# Patient Record
Sex: Male | Born: 1970 | Race: White | Hispanic: Yes | Marital: Married | Smoking: Light tobacco smoker
Health system: Southern US, Community
[De-identification: ages and names within clinical notes are randomized; demographics above are authoritative.]

## PROBLEM LIST (undated history)

## (undated) DIAGNOSIS — F419 Anxiety disorder, unspecified: Secondary | ICD-10-CM

## (undated) DIAGNOSIS — M1990 Unspecified osteoarthritis, unspecified site: Secondary | ICD-10-CM

## (undated) DIAGNOSIS — G4733 Obstructive sleep apnea (adult) (pediatric): Secondary | ICD-10-CM

## (undated) DIAGNOSIS — F329 Major depressive disorder, single episode, unspecified: Secondary | ICD-10-CM

## (undated) DIAGNOSIS — F32A Depression, unspecified: Secondary | ICD-10-CM

## (undated) DIAGNOSIS — I10 Essential (primary) hypertension: Secondary | ICD-10-CM

## (undated) DIAGNOSIS — N189 Chronic kidney disease, unspecified: Secondary | ICD-10-CM

## (undated) DIAGNOSIS — L400 Psoriasis vulgaris: Secondary | ICD-10-CM

## (undated) DIAGNOSIS — H269 Unspecified cataract: Secondary | ICD-10-CM

## (undated) HISTORY — DX: Unspecified osteoarthritis, unspecified site: M19.90

## (undated) HISTORY — DX: Psoriasis vulgaris: L40.0

## (undated) HISTORY — DX: Chronic kidney disease, unspecified: N18.9

## (undated) HISTORY — DX: Anxiety disorder, unspecified: F41.9

## (undated) HISTORY — DX: Depression, unspecified: F32.A

## (undated) HISTORY — DX: Unspecified cataract: H26.9

## (undated) HISTORY — DX: Obstructive sleep apnea (adult) (pediatric): G47.33

## (undated) HISTORY — DX: Major depressive disorder, single episode, unspecified: F32.9

## (undated) HISTORY — PX: EYE SURGERY: SHX253

## (undated) HISTORY — DX: Essential (primary) hypertension: I10

---

## 2017-02-15 ENCOUNTER — Telehealth: Payer: Self-pay

## 2017-02-15 NOTE — Telephone Encounter (Signed)
Pre visit call completed 

## 2017-02-18 ENCOUNTER — Ambulatory Visit (INDEPENDENT_AMBULATORY_CARE_PROVIDER_SITE_OTHER): Payer: BLUE CROSS/BLUE SHIELD | Admitting: Family Medicine

## 2017-02-18 ENCOUNTER — Encounter: Payer: Self-pay | Admitting: Family Medicine

## 2017-02-18 VITALS — BP 96/70 | HR 87 | Temp 98.6°F | Ht 67.0 in | Wt 237.6 lb

## 2017-02-18 DIAGNOSIS — L400 Psoriasis vulgaris: Secondary | ICD-10-CM | POA: Diagnosis not present

## 2017-02-18 DIAGNOSIS — F419 Anxiety disorder, unspecified: Secondary | ICD-10-CM

## 2017-02-18 DIAGNOSIS — I10 Essential (primary) hypertension: Secondary | ICD-10-CM

## 2017-02-18 DIAGNOSIS — M7061 Trochanteric bursitis, right hip: Secondary | ICD-10-CM

## 2017-02-18 DIAGNOSIS — M7062 Trochanteric bursitis, left hip: Secondary | ICD-10-CM

## 2017-02-18 LAB — CBC
HCT: 43.6 % (ref 39.0–52.0)
Hemoglobin: 14.6 g/dL (ref 13.0–17.0)
MCHC: 33.6 g/dL (ref 30.0–36.0)
MCV: 92 fl (ref 78.0–100.0)
PLATELETS: 381 10*3/uL (ref 150.0–400.0)
RBC: 4.73 Mil/uL (ref 4.22–5.81)
RDW: 13.7 % (ref 11.5–15.5)
WBC: 6.4 10*3/uL (ref 4.0–10.5)

## 2017-02-18 LAB — COMPREHENSIVE METABOLIC PANEL
ALBUMIN: 3.8 g/dL (ref 3.5–5.2)
ALK PHOS: 66 U/L (ref 39–117)
ALT: 16 U/L (ref 0–53)
AST: 15 U/L (ref 0–37)
BILIRUBIN TOTAL: 0.6 mg/dL (ref 0.2–1.2)
BUN: 12 mg/dL (ref 6–23)
CALCIUM: 9.2 mg/dL (ref 8.4–10.5)
CO2: 26 mEq/L (ref 19–32)
CREATININE: 0.75 mg/dL (ref 0.40–1.50)
Chloride: 104 mEq/L (ref 96–112)
GFR: 120.3 mL/min (ref >60.00)
Glucose, Bld: 99 mg/dL (ref 70–99)
Potassium: 3.6 mEq/L (ref 3.5–5.1)
Sodium: 137 mEq/L (ref 135–145)
TOTAL PROTEIN: 7.5 g/dL (ref 6.0–8.3)

## 2017-02-18 MED ORDER — METHYLPREDNISOLONE ACETATE 40 MG/ML IJ SUSP
40.0000 mg | Freq: Once | INTRAMUSCULAR | Status: AC
  Administered 2017-02-18: 40 mg

## 2017-02-18 MED ORDER — LISINOPRIL 20 MG PO TABS: 20.0000 mg | ORAL_TABLET | Freq: Every day | ORAL | 1 refills | Status: DC

## 2017-02-18 MED ORDER — CLONAZEPAM 1 MG PO TABS: 1.0000 mg | ORAL_TABLET | Freq: Every day | ORAL | 0 refills | Status: DC | PRN

## 2017-02-18 MED ORDER — PAROXETINE HCL 10 MG PO TABS: 10.0000 mg | ORAL_TABLET | Freq: Every day | ORAL | 1 refills | Status: DC

## 2017-02-18 NOTE — Progress Notes (Signed)
Chief Complaint  Patient presents with  . Establish Care    skin issues-5 years       New Patient Visit SUBJECTIVE: HPI: Michael Solomon is an 46 y.o.male who is being seen for establishing care.  The patient was previously seen at office in [place]. Here with his wife.   The patient has a history of plaque psoriasis. He currently receives monthly injections of Cosentyx. Initially, these were helping, but he has recently had a flare. When he alerted his dermatologist, no changes were made outside of homeopathic recommendations. He is very itchy and is requesting to see another dermatologist. He is currently following with Dr. Michele Mcalpine at [facility] dermatology. The patient requires blood work to have his prescription renewed.  He's also had a chronic history of bilateral hip pain. No injury or change in activity. It is located only outside. No catching or locking. No numbness or tingling.  No Known Allergies  Past Medical History:  Diagnosis Date  . Anxiety   . Arthritis   . Arthritis   . Cataract   . Chronic kidney disease   . Depression   . Hypertension   . Plaque psoriasis    Past Surgical History:  Procedure Laterality Date  . EYE SURGERY Bilateral    Social History   Social History  . Marital status: Married   Social History Main Topics  . Smoking status: Never Smoker  . Smokeless tobacco: Never Used  . Alcohol use No  . Drug use: No   Family History  Problem Relation Age of Onset  . Cancer Mother   . Dementia Mother   . Hypertension Mother   . Thyroid disease Mother   . Diabetes Father   . Heart disease Father   . Stroke Father   . Hypertension Father   . Diabetes Brother   . Depression Brother      Current Outpatient Prescriptions:  .  lisinopril (PRINIVIL,ZESTRIL) 20 MG tablet, Take 1 tablet (20 mg total) by mouth daily., Disp: 90 tablet, Rfl: 1 .  clonazePAM (KLONOPIN) 1 MG tablet, Take 1 tablet (1 mg total) by mouth daily as needed for anxiety.,  Disp: 30 tablet, Rfl: 0 .  PARoxetine (PAXIL) 10 MG tablet, Take 1 tablet (10 mg total) by mouth daily., Disp: 90 tablet, Rfl: 1 .  Secukinumab (COSENTYX SENSOREADY PEN) 150 MG/ML SOAJ, Inject 300 mg into the skin every 30 (thirty) days. , Disp: , Rfl:   ROS Skin: As noted in HPI  MSK: +hip pain   OBJECTIVE: BP 96/70 (BP Location: Left Arm, Patient Position: Sitting, Cuff Size: Large)   Pulse 87   Temp 98.6 F (37 C) (Oral)   Ht 5' 7"  (1.702 m)   Wt 237 lb 9.6 oz (107.8 kg)   SpO2 98%   BMI 37.21 kg/m   Constitutional: -  VS reviewed -  Well developed, well nourished, appears stated age -  No apparent distress  Psychiatric: -  Oriented to person, place, and time -  Memory intact -  Affect and mood normal -  Fluent conversation, good eye contact -  Judgment and insight age appropriate  Eye: -  Conjunctivae clear, no discharge -  Pupils symmetric, round, reactive to light  ENMT: -  Oral mucosa without lesions, tongue and uvula midline    Tonsils not enlarged, no erythema, no exudate, trachea midline    Pharynx moist, no lesions, no erythema  Neck: -  No gross swelling, no palpable masses -  Thyroid  midline, not enlarged, mobile, no palpable masses  Cardiovascular: -  RRR, no murmurs -  No LE edema  Respiratory: -  Normal respiratory effort, no accessory muscle use, no retraction -  Breath sounds equal, no wheezes, no ronchi, no crackles  Neurological:  -  CN II - XII grossly intact -  Sensation grossly intact to light touch, equal bilaterally  Musculoskeletal: -  No clubbing, no cyanosis -  +TTP over greater troch b/l; neg log roll, [place], FABER/FADDIR b/l  Skin: -  Scaly and faintly erythematous lesions/plaques noted on UE's, LE's, post-auricular, and torso, no drainage or fluctuance   Procedure note: Greater trochanteric bursa injection, bilateral Verbal consent obtained. The area of interest was palpated and demarcated with an otoscope speculum. It was cleaned  with an alcohol swab. Freeze spray was used. A 27 g needle was inserted at a perpendicular angle through the area of interested. The plunger was withdrawn to ensure our placement was not in a vessel. 2 mL of 1% lidocaine without epi and 40 mg of Depomedrol was injected. A bandaid was placed. This process was repeated on the contralateral side. The patient tolerated the procedure well.  There were no complications noted.    ASSESSMENT/PLAN: Plaque psoriasis - Plan: CBC, Comprehensive metabolic panel, Hepatitis B Surface AntiGEN, Ambulatory referral to Dermatology  Anxiety - Plan: clonazePAM (KLONOPIN) 1 MG tablet, PARoxetine (PAXIL) 10 MG tablet  Essential hypertension - Plan: lisinopril (PRINIVIL,ZESTRIL) 20 MG tablet  Trochanteric bursitis of both hips  Patient instructed to sign release of records form from his previous PCP. Patient should return at his earliest convenience for CPE. Will have him sign CSC at next visit. The patient voiced understanding and agreement to the plan.   [place], DO 02/18/17  9:59 AM

## 2017-02-18 NOTE — Patient Instructions (Signed)
If you do not hear anything about your referral in the next week, call our office and ask for an update.  Let us know if you need anything.

## 2017-02-18 NOTE — Addendum Note (Signed)
Addended by: Harl Bowie on: 02/18/2017 11:28 AM   Modules accepted: Orders

## 2017-02-19 LAB — HEPATITIS B SURFACE ANTIGEN: Hepatitis B Surface Ag: NEGATIVE

## 2017-02-20 ENCOUNTER — Telehealth: Payer: Self-pay | Admitting: *Deleted

## 2017-02-20 NOTE — Telephone Encounter (Signed)
-----   Message from Shelda Pal, DO sent at 02/20/2017  7:07 AM EDT ----- Let pt know his labs are normal. Please fax to [facility] on his behalf as well. He sees Dr Michele Mcalpine there. TY.

## 2017-02-21 NOTE — Telephone Encounter (Signed)
Recent lab results faxed to Dr. Michele Mcalpine at [facility] @ [phone]).

## 2017-02-21 NOTE — Telephone Encounter (Signed)
Called and spoke with the pts wife on (02/20/17) and informed her of the pt's recent lab results and note.  She verbalized understanding and she had no questions or concerns.  Informed her that I will be faxing the results to [facility].//AB/CMA

## 2017-02-22 NOTE — Telephone Encounter (Signed)
Dr. Erskin Burnet office called in to make CMA aware that fax isn't going through. She said that they are only receiving cover sheet.

## 2017-02-28 NOTE — Telephone Encounter (Signed)
Tried on several attempts to fax the pt's recent lab results to Dr. Michele Mcalpine at [facility].  Fax was unable to go through.  Mailed the recent lab results to Dr. Hassan Rowan

## 2017-03-07 NOTE — Telephone Encounter (Addendum)
Dr Michele Mcalpine office calling, stating there should have been a TB Blood work, please advise. Call back # [phone] ext [phone] Maudie Mercury

## 2017-03-12 NOTE — Telephone Encounter (Signed)
Michael Solomon called back, states she never received the fax, the correct fax number directly to her desk is [phone], please resend the fax asap.

## 2017-03-18 ENCOUNTER — Encounter: Payer: Self-pay | Admitting: Family Medicine

## 2017-03-18 ENCOUNTER — Ambulatory Visit (INDEPENDENT_AMBULATORY_CARE_PROVIDER_SITE_OTHER): Payer: BLUE CROSS/BLUE SHIELD | Admitting: Family Medicine

## 2017-03-18 ENCOUNTER — Other Ambulatory Visit: Payer: Self-pay | Admitting: Family Medicine

## 2017-03-18 VITALS — BP 120/84 | HR 97 | Temp 98.5°F | Ht 67.0 in | Wt 239.2 lb

## 2017-03-18 DIAGNOSIS — Z111 Encounter for screening for respiratory tuberculosis: Secondary | ICD-10-CM | POA: Diagnosis not present

## 2017-03-18 DIAGNOSIS — M67951 Unspecified disorder of synovium and tendon, right thigh: Secondary | ICD-10-CM

## 2017-03-18 DIAGNOSIS — M6798 Unspecified disorder of synovium and tendon, other site: Secondary | ICD-10-CM

## 2017-03-18 DIAGNOSIS — M67952 Unspecified disorder of synovium and tendon, left thigh: Secondary | ICD-10-CM | POA: Diagnosis not present

## 2017-03-18 DIAGNOSIS — G8929 Other chronic pain: Secondary | ICD-10-CM

## 2017-03-18 DIAGNOSIS — Z23 Encounter for immunization: Secondary | ICD-10-CM | POA: Diagnosis not present

## 2017-03-18 DIAGNOSIS — M545 Low back pain, unspecified: Secondary | ICD-10-CM

## 2017-03-18 DIAGNOSIS — N50819 Testicular pain, unspecified: Secondary | ICD-10-CM

## 2017-03-18 DIAGNOSIS — Z0000 Encounter for general adult medical examination without abnormal findings: Secondary | ICD-10-CM | POA: Diagnosis not present

## 2017-03-18 DIAGNOSIS — R0681 Apnea, not elsewhere classified: Secondary | ICD-10-CM

## 2017-03-18 DIAGNOSIS — Z131 Encounter for screening for diabetes mellitus: Secondary | ICD-10-CM | POA: Diagnosis not present

## 2017-03-18 DIAGNOSIS — Z125 Encounter for screening for malignant neoplasm of prostate: Secondary | ICD-10-CM

## 2017-03-18 LAB — LIPID PANEL
CHOLESTEROL: 149 mg/dL (ref 0–200)
HDL: 38.6 mg/dL — ABNORMAL LOW (ref >39.00)
LDL Cholesterol: 96 mg/dL (ref 0–99)
NonHDL: 110.84
Total CHOL/HDL Ratio: 4
Triglycerides: 72 mg/dL (ref 0.0–149.0)
VLDL: 14.4 mg/dL (ref 0.0–40.0)

## 2017-03-18 LAB — COMPREHENSIVE METABOLIC PANEL
ALBUMIN: 4.2 g/dL (ref 3.5–5.2)
ALT: 14 U/L (ref 0–53)
AST: 13 U/L (ref 0–37)
Alkaline Phosphatase: 80 U/L (ref 39–117)
BUN: 12 mg/dL (ref 6–23)
CALCIUM: 8.6 mg/dL (ref 8.4–10.5)
CHLORIDE: 104 meq/L (ref 96–112)
CO2: 26 mEq/L (ref 19–32)
CREATININE: 0.73 mg/dL (ref 0.40–1.50)
GFR: 124.07 mL/min (ref >60.00)
Glucose, Bld: 104 mg/dL — ABNORMAL HIGH (ref 70–99)
Potassium: 3.8 mEq/L (ref 3.5–5.1)
Sodium: 137 mEq/L (ref 135–145)
Total Bilirubin: 0.5 mg/dL (ref 0.2–1.2)
Total Protein: 6.8 g/dL (ref 6.0–8.3)

## 2017-03-18 LAB — CBC
HEMATOCRIT: 43.8 % (ref 39.0–52.0)
Hemoglobin: 14.5 g/dL (ref 13.0–17.0)
MCHC: 33 g/dL (ref 30.0–36.0)
MCV: 93.6 fl (ref 78.0–100.0)
PLATELETS: 275 10*3/uL (ref 150.0–400.0)
RBC: 4.68 Mil/uL (ref 4.22–5.81)
RDW: 14.3 % (ref 11.5–15.5)
WBC: 7.3 10*3/uL (ref 4.0–10.5)

## 2017-03-18 LAB — HEMOGLOBIN A1C: HEMOGLOBIN A1C: 6 % (ref 4.6–6.5)

## 2017-03-18 LAB — PSA: PSA: 0.26 ng/mL (ref 0.10–4.00)

## 2017-03-18 MED ORDER — MELOXICAM 15 MG PO TABS: 15.0000 mg | ORAL_TABLET | Freq: Every day | ORAL | 0 refills | Status: DC

## 2017-03-18 NOTE — Progress Notes (Signed)
Chief Complaint  Patient presents with  . Annual Exam    fasting    Well Male Michael Solomon is here for a complete physical.   His last physical was >1 year ago.  Current diet: in general, a "healthy" diet   Current exercise: active at work Weight trend: stable Does pt snore? Yes Daytime fatigue? No. Seat belt? Yes.     Health maintenance Tetanus- No HIV- Yes Prostate cancer screening- No   The patient is having continued pain in both of his hips (over the greater trochanteric region) and low back. 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 He was given bilateral steroid injections which helped for 3 days and then wore off. He's never been to physical therapy.  He also mentioned that he feels a nodule on his testicles. He cannot find it today. His wife states that he mention it was painful, however he states he is having no pain. No urinary discomfort or bleeding. No skin lesions.  His dermatologist is requesting that we obtain a QuantiFERON gold to screen for TB.  Past Medical History:  Diagnosis Date  . Anxiety   . Arthritis   . Arthritis   . Cataract   . Chronic kidney disease   . Depression   . Hypertension   . Plaque psoriasis     Past Surgical History:  Procedure Laterality Date  . EYE SURGERY Bilateral    Medications  Current Outpatient Prescriptions on File Prior to Visit  Medication Sig Dispense Refill  . clonazePAM (KLONOPIN) 1 MG tablet Take 1 tablet (1 mg total) by mouth daily as needed for anxiety. 30 tablet 0  . lisinopril (PRINIVIL,ZESTRIL) 20 MG tablet Take 1 tablet (20 mg total) by mouth daily. 90 tablet 1  . PARoxetine (PAXIL) 10 MG tablet Take 1 tablet (10 mg total) by mouth daily. 90 tablet 1  . Secukinumab (COSENTYX SENSOREADY PEN) 150 MG/ML SOAJ Inject 300 mg into the skin every 30 (thirty) days.      Allergies No Known Allergies Family History Family History  Problem Relation Age of Onset  . Cancer Mother   . Dementia Mother   .  Hypertension Mother   . Thyroid disease Mother   . Diabetes Father   . Heart disease Father   . Stroke Father   . Hypertension Father   . Diabetes Brother   . Depression Brother     Review of Systems: Constitutional:  no unexpected change in weight, no fevers or chills Eye:  no recent significant change in vision Ear/Nose/Mouth/Throat:  Ears:  no tinnitus or hearing loss Nose/Mouth/Throat:  no complaints of nasal congestion or bleeding, no sore throat and oral sores Cardiovascular:  no chest pain, no palpitations Respiratory:  no cough and no shortness of breath Gastrointestinal:  no abdominal pain, no change in bowel habits, no nausea, vomiting, diarrhea, or constipation and no black or bloody stool GU:  Male: negative for dysuria, frequency, and incontinence and negative for prostate symptoms Musculoskeletal/Extremities: +low back and b/l hip pain; otherwise no pain, redness, or swelling of the joints Integumentary (Skin/Breast):  no abnormal skin lesions reported Neurologic:  no headaches, no numbness, tingling Endocrine: No weight changes, masses in the neck, heat/cold intolerance, bowel or skin changes, or cardiovascular system symptoms Hematologic/Lymphatic:  no abnormal bleeding, no HIV risk factors, no night sweats, no swollen nodes, no weight loss  Exam BP 120/84 (BP Location: Left Arm, Patient Position: Sitting, Cuff Size: Normal)   Pulse 97  Temp 98.5 F (36.9 C) (Oral)   Ht 5' 7"  (1.702 m)   Wt 239 lb 3.2 oz (108.5 kg)   SpO2 97%   BMI 37.46 kg/m  General:  well developed, well nourished, in no apparent distress Skin: +scaling and erythema over forearms/posterior ears/neck; otherwise no significant moles, warts, or growths Head:  no masses, lesions, or tenderness Eyes:  pupils equal and round, sclera anicteric without injection Ears:  canals without lesions, TMs shiny without retraction, no obvious effusion, no erythema Nose:  nares patent, septum midline, mucosa  normal Throat/Pharynx:  lips and gingiva without lesion; tongue and uvula midline; non-inflamed pharynx; no exudates or postnasal drainage Neck: neck supple without adenopathy, thyromegaly, or masses Lungs:  clear to auscultation, breath sounds equal bilaterally, no respiratory distress Cardio:  regular rate and rhythm without murmurs, heart sounds without clicks or rubs Abdomen:  abdomen soft, nontender; bowel sounds normal; no masses or organomegaly Genital (male): Uncircumcised penis, no lesions or discharge; testes present bilaterally without masses or tenderness Rectal: Deferred Musculoskeletal:  symmetrical muscle groups noted without atrophy or deformity Extremities:  no clubbing, cyanosis, or edema, no deformities, no skin discoloration Neuro:  gait normal; deep tendon reflexes normal and symmetric Psych: well oriented with normal range of affect and appropriate judgment/insight  Assessment and Plan  Well adult exam - Plan: CBC, Comprehensive metabolic panel, Lipid panel  Witnessed apneic spells - Plan: Ambulatory referral to Pulmonology  Screening for prostate cancer - Plan: PSA  Screening for diabetes mellitus - Plan: Hemoglobin A1c  Chronic bilateral low back pain without sciatica - Plan: meloxicam (MOBIC) 15 MG tablet, Ambulatory referral to Physical Therapy  Tendinopathy of left gluteus medius - Plan: meloxicam (MOBIC) 15 MG tablet, Ambulatory referral to Physical Therapy  Screening-pulmonary TB - Plan: Quantiferon tb gold assay  Tendinopathy of right gluteus medius - Plan: meloxicam (MOBIC) 15 MG tablet, Ambulatory referral to Physical Therapy   Well 46 y.o. male. Counseled on diet and exercise. Counseled on the risks and benefits of PSA screening, he agreed to go forth with screening. Screening for sleep apnea. We'll obtain QuantiFERON gold and his blood testing at the request of his dermatologist. I do not feel anything on exam and he could not pinpoint where the  nodules were during the visit. I will order an US of the area to make sure nothing is there that shouldn't be and for reassurance. We'll trial short course of Mobic and refer to physical therapy for his pain.  Other orders as above. Follow up in 6 mo pending the above workup. The patient voiced understanding and agreement to the plan.  [place], DO 03/18/17 11:04 AM

## 2017-03-18 NOTE — Patient Instructions (Addendum)
Give [place] 2-3 business days to get the results of your labs back. If labs are normal, you will likely receive a letter in the mail unless you have MyChart. This can take longer than 2-3 business days.   Find out about your pneumonia vaccination history.  Let us know if you need anything.

## 2017-03-18 NOTE — Addendum Note (Signed)
Addended by: Harl Bowie on: 03/18/2017 12:05 PM   Modules accepted: Orders

## 2017-03-19 LAB — QUANTIFERON TB GOLD ASSAY (BLOOD)
Interferon Gamma Release Assay: POSITIVE — AB
Mitogen-Nil: 9.71 IU/mL
Quantiferon Nil Value: 0.02 IU/mL
Quantiferon Tb Ag Minus Nil Value: 9.78 IU/mL

## 2017-03-19 NOTE — Telephone Encounter (Signed)
Left detailed message on Michael Solomon's voicemail below that TB blood test is not final yet and we will fax all labs to most recent fax # below and to call if any additional questions.

## 2017-03-20 NOTE — Telephone Encounter (Signed)
Per Dr. Nani Ravens please reach out to [facility] regarding the pt's recent postive Quantiferon Gold TB.  To see if they want a CXR on the pt.  The pt thought he may need one also.  He is asymptomatic.  Called and spoke with Colletta Maryland and informed her of the message from Dr. Nani Ravens.  She asked me to fax the results and she will contact Dr. Michele Mcalpine and she will give me a call back.  Results faxed.

## 2017-03-21 ENCOUNTER — Telehealth: Payer: Self-pay | Admitting: *Deleted

## 2017-03-21 DIAGNOSIS — R7612 Nonspecific reaction to cell mediated immunity measurement of gamma interferon antigen response without active tuberculosis: Secondary | ICD-10-CM

## 2017-03-21 NOTE — Telephone Encounter (Signed)
-----   Message from Shelda Pal, DO sent at 03/19/2017  2:40 PM EDT ----- Prediabetes. 10 yr CVD is 1.4%. 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 I imagine they will want a CXR with this result. Will try to reach out to see.  AB- Let pt know his sugar is a little high and his other labs look good. We ordered a lab at the request of his dermatologist that was positive (Quantiferon Gold for Tb). Let's see if we can reach out to their office regarding this result and if they want a CXR. The patient thought he may need one also. He is asymptomatic. TY.

## 2017-03-21 NOTE — Telephone Encounter (Signed)
Faxed recent lab results to Dr. [facility] on (03/20/17).  Awaiting response from Dermatology.//AB/CMA

## 2017-03-21 NOTE — Telephone Encounter (Signed)
Called and spoke with the pt's wife regarding the pt's recent lab results and note.  She verbalized understanding and agreed.  Also informed her that I faxed the labs to Dr. [facility].//AB/CMA

## 2017-03-22 NOTE — Telephone Encounter (Signed)
Called and [facility] @ 9:01am @ [phone]) asking Colletta Maryland to RTC regarding the pt's recent lab results.//AB/CMA

## 2017-03-23 ENCOUNTER — Encounter (HOSPITAL_BASED_OUTPATIENT_CLINIC_OR_DEPARTMENT_OTHER): Payer: BLUE CROSS/BLUE SHIELD

## 2017-03-23 ENCOUNTER — Ambulatory Visit (HOSPITAL_BASED_OUTPATIENT_CLINIC_OR_DEPARTMENT_OTHER): Payer: BLUE CROSS/BLUE SHIELD

## 2017-03-26 NOTE — Telephone Encounter (Signed)
Caller name: Maudie Mercury @ [facility] Can be reached: [phone] ext [phone]  Reason for call: Dr. Michele Mcalpine reviewed TB results. This time it is positive (last time was negative). He would like to have a chest xray completed please.

## 2017-03-27 NOTE — Telephone Encounter (Addendum)
Spoke with the pt's wife and informed her of the message below from Dr. Michele Mcalpine office.  She verbalized understanding and agreed.  Informed her that the pt can come for the x-ray at his convenience.  She stated that they are having some problems with their insurance, so it maybe a little while before the pt can come and get the x-ray.  Made Dr. Nani Ravens aware and he stated that it's fine.//AB/CMA

## 2017-03-27 NOTE — Telephone Encounter (Signed)
Called and [facility] @ 8:11am @ [phone]) asking the pt's wife to RTC regarding the message below.//AB/CMA

## 2017-03-30 ENCOUNTER — Ambulatory Visit (HOSPITAL_BASED_OUTPATIENT_CLINIC_OR_DEPARTMENT_OTHER): Payer: BLUE CROSS/BLUE SHIELD

## 2017-04-17 ENCOUNTER — Telehealth: Payer: Self-pay | Admitting: Family Medicine

## 2017-04-17 ENCOUNTER — Other Ambulatory Visit: Payer: Self-pay | Admitting: Family Medicine

## 2017-04-17 DIAGNOSIS — G8929 Other chronic pain: Secondary | ICD-10-CM

## 2017-04-17 DIAGNOSIS — M67952 Unspecified disorder of synovium and tendon, left thigh: Secondary | ICD-10-CM

## 2017-04-17 DIAGNOSIS — M545 Low back pain: Principal | ICD-10-CM

## 2017-04-17 DIAGNOSIS — M6798 Unspecified disorder of synovium and tendon, other site: Secondary | ICD-10-CM

## 2017-04-17 DIAGNOSIS — M67951 Unspecified disorder of synovium and tendon, right thigh: Secondary | ICD-10-CM

## 2017-04-17 DIAGNOSIS — F419 Anxiety disorder, unspecified: Secondary | ICD-10-CM

## 2017-04-17 MED ORDER — CLONAZEPAM 1 MG PO TABS: 1.0000 mg | ORAL_TABLET | Freq: Every day | ORAL | 1 refills | Status: DC | PRN

## 2017-04-17 NOTE — Telephone Encounter (Signed)
OK to refill 30 w 1 refill, I would like to see him at his earliest convenience to discuss anxiety. TY.
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
## 2017-04-17 NOTE — Telephone Encounter (Signed)
Please advise on Klonopin refill  Last filled 02/18/17 Qty: 30 Rf:0 Last ov 03/18/17

## 2017-04-17 NOTE — Telephone Encounter (Signed)
Caller name: [place]  Relation to pt: self  Call back number: [phone] Pharmacy:  Reason for call: Pt is requesting refill on clonazePAM (KLONOPIN) 1 MG tablet. Please advise.

## 2017-04-24 ENCOUNTER — Encounter: Payer: Self-pay | Admitting: Family Medicine

## 2017-04-24 ENCOUNTER — Ambulatory Visit (INDEPENDENT_AMBULATORY_CARE_PROVIDER_SITE_OTHER): Payer: BLUE CROSS/BLUE SHIELD | Admitting: Family Medicine

## 2017-04-24 VITALS — BP 108/82 | HR 76 | Temp 98.5°F | Ht 67.0 in | Wt 239.0 lb

## 2017-04-24 DIAGNOSIS — F411 Generalized anxiety disorder: Secondary | ICD-10-CM | POA: Diagnosis not present

## 2017-04-24 MED ORDER — PAROXETINE HCL 20 MG PO TABS: 20.0000 mg | ORAL_TABLET | Freq: Every day | ORAL | 2 refills | Status: DC

## 2017-04-24 NOTE — Progress Notes (Signed)
Pre visit review using our clinic review tool, if applicable. No additional management support is needed unless otherwise documented below in the visit note. 

## 2017-04-24 NOTE — Patient Instructions (Addendum)
Please consider counseling. Contact [phone] to schedule an appointment or inquire about cost/insurance coverage.  Let us know if you need anything.

## 2017-04-24 NOTE — Progress Notes (Signed)
Chief Complaint  Patient presents with  . Medication Refill    followup on anxiety medication    Subjective Michael Solomon is an 46 y.o. male who presents with anxiety. Currently taking Paxil 10 mg daily and Klonopin 1 mg daily prn. He ends up using the latter on a daily basis due to stressors. Compliant with Paxil Family history significant for anxiety and depression. Possible organic causes contributing are: none Social stressors include very ill mother who he is responsible for caring for. No self medication. No SI or HI. He does not exercise. He is following with a psychologist.  Past Medical History:  Diagnosis Date  . Anxiety   . Arthritis   . Arthritis   . Cataract   . Chronic kidney disease   . Depression   . Hypertension   . Plaque psoriasis     Medications Current Outpatient Prescriptions on File Prior to Visit  Medication Sig Dispense Refill  . ammonium lactate (LAC-HYDRIN) 12 % lotion APPLY TO THE AFFECTED AREAS OF SKIN BID PRN  2  . clonazePAM (KLONOPIN) 1 MG tablet Take 1 tablet (1 mg total) by mouth daily as needed for anxiety. 30 tablet 1  . lisinopril (PRINIVIL,ZESTRIL) 20 MG tablet Take 1 tablet (20 mg total) by mouth daily. 90 tablet 1  . meloxicam (MOBIC) 15 MG tablet TAKE 1 TABLET(15 MG) BY MOUTH DAILY 30 tablet 0  . PARoxetine (PAXIL) 10 MG tablet Take 1 tablet (10 mg total) by mouth daily. 90 tablet 1  . Secukinumab (COSENTYX SENSOREADY PEN) 150 MG/ML SOAJ Inject 300 mg into the skin every 30 (thirty) days.      Allergies No Known Allergies Family History Family History  Problem Relation Age of Onset  . Cancer Mother   . Dementia Mother   . Hypertension Mother   . Thyroid disease Mother   . Diabetes Father   . Heart disease Father   . Stroke Father   . Hypertension Father   . Diabetes Brother   . Depression Brother      Review Of Systems Constitutional:  no unexplained fevers, sweats, or chills Cardiovascular:  no chest pain, no  palpitations Gastrointestinal:  no nausea, vomiting, diarrhea, or constipation Psychiatric: as noted in HPI  Exam BP 108/82 (BP Location: Left Arm, Patient Position: Sitting, Cuff Size: Normal)   Pulse 76   Temp 98.5 F (36.9 C) (Oral)   Ht 5' 7"  (1.702 m)   Wt 239 lb (108.4 kg)   SpO2 96%   BMI 37.43 kg/m  General:  well developed, well nourished, in no apparent distress Neck: neck supple without adenopathy, thyromegaly, or masses Lungs:  clear to auscultation, breath sounds equal bilaterally, normal respiratory effort without accessory muscle use Cardio:  regular rate and rhythm without murmurs Neuro:  deep tendon reflexes normal and symmetric and no cerebellar signs or ataxia noted Psych: well oriented with normal range of affect and age-appropriate judgement/insight  Assessment and Plan  GAD (generalized anxiety disorder) - Plan: PARoxetine (PAXIL) 20 MG tablet  Status: New to me  Increase dose of Paxil to 20 mg daily from 10 mg daily. Cont Klonopin. # for counseling given. Counseled on exercise helping with anxiety.  F.u in 1 mo. If no improvement, will increase to 30 mg vs adding Buspirone.  Patient voiced understanding and agreement to the plan.  [place], DO 04/24/17 11:46 AM

## 2017-06-11 ENCOUNTER — Other Ambulatory Visit: Payer: Self-pay | Admitting: Family Medicine

## 2017-06-11 DIAGNOSIS — G8929 Other chronic pain: Secondary | ICD-10-CM

## 2017-06-11 DIAGNOSIS — M545 Low back pain, unspecified: Secondary | ICD-10-CM

## 2017-06-11 DIAGNOSIS — M6798 Unspecified disorder of synovium and tendon, other site: Secondary | ICD-10-CM

## 2017-06-11 DIAGNOSIS — M67951 Unspecified disorder of synovium and tendon, right thigh: Secondary | ICD-10-CM

## 2017-06-11 DIAGNOSIS — M67952 Unspecified disorder of synovium and tendon, left thigh: Secondary | ICD-10-CM

## 2017-06-21 ENCOUNTER — Other Ambulatory Visit: Payer: Self-pay | Admitting: Family Medicine

## 2017-06-21 DIAGNOSIS — F419 Anxiety disorder, unspecified: Secondary | ICD-10-CM

## 2017-06-24 ENCOUNTER — Ambulatory Visit (INDEPENDENT_AMBULATORY_CARE_PROVIDER_SITE_OTHER): Payer: BLUE CROSS/BLUE SHIELD | Admitting: Pulmonary Disease

## 2017-06-24 ENCOUNTER — Encounter: Payer: Self-pay | Admitting: Pulmonary Disease

## 2017-06-24 VITALS — BP 130/88 | HR 104 | Ht 67.0 in | Wt 242.4 lb

## 2017-06-24 DIAGNOSIS — G4733 Obstructive sleep apnea (adult) (pediatric): Secondary | ICD-10-CM | POA: Insufficient documentation

## 2017-06-24 DIAGNOSIS — Z23 Encounter for immunization: Secondary | ICD-10-CM | POA: Diagnosis not present

## 2017-06-24 HISTORY — DX: Obstructive sleep apnea (adult) (pediatric): G47.33

## 2017-06-24 NOTE — Telephone Encounter (Signed)
Requesting:   clonazepam Contract    none UDS     none Last OV     04/24/2017 Last Refill   #30 with 1 refill on 04/17/2017  Please Advise

## 2017-06-24 NOTE — Assessment & Plan Note (Addendum)
Given excessive daytime somnolence, narrow pharyngeal exam, witnessed apneas & loud snoring, obstructive sleep apnea is very likely & an overnight polysomnogram will be scheduled as a homestudy. The pathophysiology of obstructive sleep apnea , it's cardiovascular consequences & modes of treatment including CPAP were discused with the patient in detail & they evidenced understanding.  Pretest probability is high and he will very likely need a CPAP titration study subsequently  His body jerks during sleep are likely also related to apneas and hopefully will improve with CPAP therapy

## 2017-06-24 NOTE — Progress Notes (Signed)
Subjective:    Patient ID: Michael Solomon, male    DOB: [birth year], 46 y.o.   MRN: [id number]  HPI   Chief Complaint  Patient presents with  . Advice Only    new consult. has apnea and loud snoring per wife. feels tired all the time.     67 year old obese man originally from [place], presents accompanied by his wife for evaluation of sleep disordered breathing.  His wife provides a sleep history. She reports witnessed apneas where he briefly stops breathing especially when sleeping on his back.  She reports loud snoring and jerking movements occasionally wake him up from his sleep. His father has obstructive sleep apnea and uses a CPAP machine. He has worked there is third shift for at least the last 15 years and so his sleep habits are very variable.  He takes care of his mom in the daytime.  On work days he will sleep from 8 AM to 10 AM and then be awake until 5 PM when he sleeps again until 10 PM, on his nights off he reverts back to her nighttime sleeping schedule. Epworth sleepiness score is 9 and reports sleepiness as a passenger in a car, lying down to rest in the afternoons, sleep latency is minimal, he reports multiple awakenings including for bathroom visits, he sleeps on his left side with one pillow.  He wakes up with a non-refreshing sleep feeling tired with dryness of mouth and occasional headache.  He has gained 20 pounds over the last 2 years. He has high blood pressure controlled on 2 medications. He has anxiety for which she takes clonazepam.  He also has been treated in the past for latent tuberculosis  There is no history suggestive of cataplexy, sleep paralysis or parasomnias    Past Medical History:  Diagnosis Date  . Anxiety   . Arthritis   . Arthritis   . Cataract   . Chronic kidney disease   . Depression   . Hypertension   . Plaque psoriasis      Past Surgical History:  Procedure Laterality Date  . EYE SURGERY Bilateral     No Known  Allergies Social History   Socioeconomic History  . Marital status: Married    Spouse name: Not on file  . Number of children: Not on file  . Years of education: Not on file  . Highest education level: Not on file  Social Needs  . Financial resource strain: Not on file  . Food insecurity - worry: Not on file  . Food insecurity - inability: Not on file  . Transportation needs - medical: Not on file  . Transportation needs - non-medical: Not on file  Occupational History  . Not on file  Tobacco Use  . Smoking status: Light Tobacco Smoker    Packs/day: 0.10    Years: 25.00    Pack years: 2.50    Types: Cigarettes  . Smokeless tobacco: Never Used  Substance and Sexual Activity  . Alcohol use: No  . Drug use: No  . Sexual activity: Not on file  Other Topics Concern  . Not on file  Social History Narrative  . Not on file      Family History  Problem Relation Age of Onset  . Cancer Mother   . Dementia Mother   . Hypertension Mother   . Thyroid disease Mother   . Diabetes Father   . Heart disease Father   . Stroke Father   . Hypertension Father   .  Diabetes Brother   . Depression Brother      Review of Systems  HENT: Positive for dental problem, ear pain and sneezing.   Eyes: Positive for itching.  Respiratory: Positive for chest tightness and wheezing.   Cardiovascular: Positive for palpitations.  Musculoskeletal: Positive for joint swelling.  Skin: Positive for rash.  Allergic/Immunologic: Negative.   Neurological: Positive for headaches.  Psychiatric/Behavioral: The patient is nervous/anxious.        Objective:   Physical Exam  Gen. Pleasant, obese, in no distress, normal affect ENT -enlarged tonsils, no post nasal drip, class 2-3 airway Neck: No JVD, no thyromegaly, no carotid bruits Lungs: no use of accessory muscles, no dullness to percussion, decreased without rales or rhonchi  Cardiovascular: Rhythm regular, heart sounds  normal, no murmurs or  gallops, no peripheral edema Abdomen: soft and non-tender, no hepatosplenomegaly, BS normal. Musculoskeletal: No deformities, no cyanosis or clubbing Neuro:  alert, non focal, no tremors       Assessment & Plan:

## 2017-06-24 NOTE — Telephone Encounter (Signed)
Called the patient informed sent in. Scheduled followup appt with PCP for next Monday 07/01/2017

## 2017-06-24 NOTE — Patient Instructions (Signed)
Home sleep study. Based on this, you will likely need a CPAP machine Flu shot today

## 2017-06-24 NOTE — Telephone Encounter (Signed)
Rx sent electronically. Please schedule him for follow up. TY.

## 2017-07-01 ENCOUNTER — Ambulatory Visit: Payer: BLUE CROSS/BLUE SHIELD | Admitting: Family Medicine

## 2017-07-22 ENCOUNTER — Telehealth: Payer: Self-pay | Admitting: Pulmonary Disease

## 2017-07-22 DIAGNOSIS — G4733 Obstructive sleep apnea (adult) (pediatric): Secondary | ICD-10-CM | POA: Diagnosis not present

## 2017-07-22 NOTE — Telephone Encounter (Signed)
Rodena Piety has spoke to the wife

## 2017-07-23 ENCOUNTER — Telehealth: Payer: Self-pay | Admitting: Pulmonary Disease

## 2017-07-23 DIAGNOSIS — G4733 Obstructive sleep apnea (adult) (pediatric): Secondary | ICD-10-CM | POA: Diagnosis not present

## 2017-07-23 NOTE — Telephone Encounter (Signed)
Per RA, HST showed moderate OSA with 13 events per hour. Apneas are worse when sleeping on back. Suggest CPAP therapy with an Auto CPAP 5-15cm, mask of choice, humidity, DL/OV in 4 weeks.   Will place order once patient is aware.

## 2017-07-24 NOTE — Telephone Encounter (Signed)
Michael Solomon, wife, returned call.  CB is [phone].

## 2017-07-24 NOTE — Telephone Encounter (Signed)
Spoke with pt's wife, Arrie Aran. She is aware of pt's sleep study results. Order has been placed for CPAP. OV has been scheduled for follow up. Nothing further was needed.

## 2017-07-24 NOTE — Telephone Encounter (Signed)
Left message for patient to call back for results.  

## 2017-07-26 ENCOUNTER — Encounter: Payer: Self-pay | Admitting: Family Medicine

## 2017-07-26 ENCOUNTER — Ambulatory Visit: Payer: BLUE CROSS/BLUE SHIELD | Admitting: Family Medicine

## 2017-07-26 VITALS — BP 130/86 | HR 82 | Temp 98.8°F | Ht 67.0 in | Wt 243.4 lb

## 2017-07-26 DIAGNOSIS — J209 Acute bronchitis, unspecified: Secondary | ICD-10-CM

## 2017-07-26 DIAGNOSIS — F419 Anxiety disorder, unspecified: Secondary | ICD-10-CM

## 2017-07-26 DIAGNOSIS — G8929 Other chronic pain: Secondary | ICD-10-CM

## 2017-07-26 DIAGNOSIS — M545 Low back pain: Secondary | ICD-10-CM | POA: Diagnosis not present

## 2017-07-26 MED ORDER — PAROXETINE HCL 30 MG PO TABS: 30.0000 mg | ORAL_TABLET | Freq: Every day | ORAL | 2 refills | Status: DC

## 2017-07-26 MED ORDER — CLONAZEPAM 0.5 MG PO TABS: 0.5000 mg | ORAL_TABLET | Freq: Two times a day (BID) | ORAL | 5 refills | Status: DC | PRN

## 2017-07-26 MED ORDER — IPRATROPIUM-ALBUTEROL 0.5-2.5 (3) MG/3ML IN SOLN: 3.0000 mL | Freq: Four times a day (QID) | RESPIRATORY_TRACT | Status: DC

## 2017-07-26 MED ORDER — IPRATROPIUM-ALBUTEROL 0.5-2.5 (3) MG/3ML IN SOLN
3.0000 mL | Freq: Once | RESPIRATORY_TRACT | Status: AC
  Administered 2017-07-26: 3 mL via RESPIRATORY_TRACT

## 2017-07-26 MED ORDER — PREDNISONE 20 MG PO TABS: 40.0000 mg | ORAL_TABLET | Freq: Every day | ORAL | 0 refills | Status: AC

## 2017-07-26 MED ORDER — ATORVASTATIN CALCIUM 40 MG PO TABS: 40.0000 mg | ORAL_TABLET | Freq: Every day | ORAL | 3 refills | Status: DC

## 2017-07-26 MED ORDER — AZITHROMYCIN 250 MG PO TABS: ORAL_TABLET | ORAL | 0 refills | Status: DC

## 2017-07-26 NOTE — Progress Notes (Signed)
Chief Complaint  Patient presents with  . Sinus Problem  . Headache    Nuel Dejaynes here for URI complaints.  Duration: 5 days  Associated symptoms: sinus headache, sinus congestion, rhinorrhea,  ear fullness, wheezing, shortness of breath and fevers tmax of 103 F Denies: sinus pain, itchy watery eyes, ear pain, ear drainage, sore throat, myalgia and rigors Treatment to date: None Sick contacts: Yes   Anxiety- Doing well on increased dose of Paxil. Still using Klonopin 0.5 mg BID, generally every day. Not following with counselor. Having social stressors including his wife's and mother's health. No HI or SI.  Continue lbp. Uses Mobic. Would like to see someone in area for this. Wife sees pain management, but he was hoping for something in the building.   ROS:  Const: +fevers HEENT: As noted in HPI Lungs: +SOB  Past Medical History:  Diagnosis Date  . Anxiety   . Arthritis   . Arthritis   . Cataract   . Chronic kidney disease   . Depression   . Hypertension   . OSA (obstructive sleep apnea) 06/24/2017  . Plaque psoriasis    Family History  Problem Relation Age of Onset  . Cancer Mother   . Dementia Mother   . Hypertension Mother   . Thyroid disease Mother   . Diabetes Father   . Heart disease Father   . Stroke Father   . Hypertension Father   . Diabetes Brother   . Depression Brother     BP 130/86 (BP Location: Left Arm, Patient Position: Sitting, Cuff Size: Large)   Pulse 82   Temp 98.8 F (37.1 C) (Oral)   Ht 5' 7"  (1.702 m)   Wt 243 lb 6 oz (110.4 kg)   SpO2 93%   BMI 38.12 kg/m  General: Awake, alert, appears stated age HEENT: AT, [place], ears patent b/l and TM's neg, nares patent w/o discharge, pharynx pink and without exudates, MMM Neck: No masses or asymmetry Heart: RRR, no murmurs, no bruits Lungs: CTAB, no accessory muscle use Psych: Age appropriate judgment and insight, normal mood and affect  Acute wheezy bronchitis - Plan: predniSONE  (DELTASONE) 20 MG tablet, ipratropium-albuterol (DUONEB) 0.5-2.5 (3) MG/3ML nebulizer solution 3 mL, ipratropium-albuterol (DUONEB) 0.5-2.5 (3) MG/3ML nebulizer solution 3 mL, azithromycin (ZITHROMAX) 250 MG tablet  Anxiety - Plan: clonazePAM (KLONOPIN) 0.5 MG tablet, PARoxetine (PAXIL) 30 MG tablet  Chronic bilateral low back pain without sciatica - Plan: Ambulatory referral to Sports Medicine  Orders as above. Abx given fever, prednisone given wheezing.  Increase dose of Paxil from 20 mg/d to 30 mg/d. Cont Klonopin in meanwhile.  Refer to Dr. Barbaraann Barthel for consideration of SI jt injection vs other management.  Continue to push fluids, practice good hand hygiene, cover mouth when coughing. F/u in 1 mo. Pt voiced understanding and agreement to the plan.  Martha Lake, DO 07/26/17 10:17 AM

## 2017-07-26 NOTE — Patient Instructions (Addendum)
If you do not hear anything about your referral in the next 1-2 weeks, call our office and ask for an update.  Continue to push fluids, practice good hand hygiene, and cover your mouth if you cough.  If you start having persistent fevers, shaking or worsening shortness of breath, seek immediate care.  Thank you for the gift, have a great holiday and New Year's!

## 2017-07-26 NOTE — Progress Notes (Signed)
Pre visit review using our clinic review tool, if applicable. No additional management support is needed unless otherwise documented below in the visit note. 

## 2017-07-31 ENCOUNTER — Other Ambulatory Visit: Payer: Self-pay | Admitting: Family Medicine

## 2017-07-31 ENCOUNTER — Other Ambulatory Visit: Payer: Self-pay | Admitting: *Deleted

## 2017-07-31 DIAGNOSIS — G4733 Obstructive sleep apnea (adult) (pediatric): Secondary | ICD-10-CM

## 2017-07-31 MED ORDER — TADALAFIL 5 MG PO TABS: ORAL_TABLET | ORAL | 1 refills | Status: DC

## 2017-07-31 MED ORDER — TADALAFIL 5 MG PO TABS: 5.0000 mg | ORAL_TABLET | Freq: Every day | ORAL | 1 refills | Status: DC | PRN

## 2017-07-31 NOTE — Telephone Encounter (Signed)
Ok to send in 90 day

## 2017-07-31 NOTE — Progress Notes (Signed)
Cialis called in. Pt notified.

## 2017-07-31 NOTE — Addendum Note (Signed)
Addended by: Sharon Seller B on: 07/31/2017 08:38 AM   Modules accepted: Orders

## 2017-08-12 ENCOUNTER — Ambulatory Visit (HOSPITAL_BASED_OUTPATIENT_CLINIC_OR_DEPARTMENT_OTHER)
Admission: RE | Admit: 2017-08-12 | Discharge: 2017-08-12 | Disposition: A | Discharge status: Home / Self Care | Payer: BLUE CROSS/BLUE SHIELD | Source: Ambulatory Visit | Attending: Family Medicine | Admitting: Family Medicine

## 2017-08-12 ENCOUNTER — Other Ambulatory Visit: Payer: Self-pay | Admitting: Family Medicine

## 2017-08-12 ENCOUNTER — Ambulatory Visit: Payer: BLUE CROSS/BLUE SHIELD | Admitting: Family Medicine

## 2017-08-12 ENCOUNTER — Encounter: Payer: Self-pay | Admitting: Family Medicine

## 2017-08-12 VITALS — BP 123/79 | HR 81 | Ht 67.0 in | Wt 242.0 lb

## 2017-08-12 DIAGNOSIS — M79605 Pain in left leg: Secondary | ICD-10-CM

## 2017-08-12 DIAGNOSIS — M545 Low back pain: Secondary | ICD-10-CM | POA: Diagnosis not present

## 2017-08-12 DIAGNOSIS — M79604 Pain in right leg: Secondary | ICD-10-CM

## 2017-08-12 MED ORDER — HYDROCODONE-ACETAMINOPHEN 7.5-325 MG PO TABS: 1.0000 | ORAL_TABLET | Freq: Four times a day (QID) | ORAL | 0 refills | Status: DC | PRN

## 2017-08-12 MED ORDER — TIZANIDINE HCL 4 MG PO TABS: 4.0000 mg | ORAL_TABLET | Freq: Four times a day (QID) | ORAL | 1 refills | Status: DC | PRN

## 2017-08-12 MED ORDER — PREDNISONE 10 MG PO TABS: ORAL_TABLET | ORAL | 0 refills | Status: DC

## 2017-08-12 NOTE — Patient Instructions (Signed)
I'm concerned you have spinal stenosis of your lumbar spine. A prednisone dose pack is the best option for immediate relief and may be prescribed - do not take ibuprofen or aleve while on this. Day after finishing prednisone you can resume taking ibuprofen or aleve with food for pain and inflammation (you could also try diclofenac instead but let me know if you want to try this). Norco as needed for severe pain (no driving on this medicine). Tizanidine as needed for muscle spasms (no driving on this medicine if it makes you sleepy). Stay as active as possible. Physical therapy has been shown to be helpful as well - start this. Strengthening of low back muscles, abdominal musculature are key for long term pain relief. If not improving, will consider further imaging (MRI). Follow up with me in 1 month for reevaluation.Marland Kitchen

## 2017-08-14 ENCOUNTER — Encounter: Payer: Self-pay | Admitting: Family Medicine

## 2017-08-14 DIAGNOSIS — M545 Low back pain, unspecified: Secondary | ICD-10-CM | POA: Insufficient documentation

## 2017-08-14 DIAGNOSIS — M79605 Pain in left leg: Secondary | ICD-10-CM | POA: Insufficient documentation

## 2017-08-14 NOTE — Progress Notes (Signed)
PCP: Shelda Pal, DO  Subjective:   HPI: Patient is a 47 y.o. male here for low back pain.  Patient reports for about 2-3 years at least he's struggled with pain in his low back radiating into both hips. Pain level 8/10 and sharp. Worse with any activities but especially at end of work day, going from sitting to standing. No numbness or tingling. No bowel/bladder dysfunction. Difficulty sleeping. No benefit with meloxicam, cortisone injections for trochanteric bursitis.  Past Medical History:  Diagnosis Date  . Anxiety   . Arthritis   . Arthritis   . Cataract   . Chronic kidney disease   . Depression   . Hypertension   . OSA (obstructive sleep apnea) 06/24/2017  . Plaque psoriasis     Current Outpatient Medications on File Prior to Visit  Medication Sig Dispense Refill  . ammonium lactate (LAC-HYDRIN) 12 % lotion APPLY TO THE AFFECTED AREAS OF SKIN BID PRN  2  . atorvastatin (LIPITOR) 40 MG tablet   3  . clonazePAM (KLONOPIN) 0.5 MG tablet Take 1 tablet (0.5 mg total) by mouth 2 (two) times daily as needed for anxiety. 60 tablet 5  . lisinopril (PRINIVIL,ZESTRIL) 20 MG tablet Take 1 tablet (20 mg total) by mouth daily. 90 tablet 1  . PARoxetine (PAXIL) 30 MG tablet Take 1 tablet (30 mg total) by mouth daily. 30 tablet 2  . Secukinumab (COSENTYX SENSOREADY PEN) 150 MG/ML SOAJ Inject 300 mg into the skin every 30 (thirty) days.     . tadalafil (CIALIS) 5 MG tablet TAKE 1 TABLET(5 MG) BY MOUTH DAILY AS NEEDED FOR ERECTILE DYSFUNCTION 90 tablet 1   Current Facility-Administered Medications on File Prior to Visit  Medication Dose Route Frequency Provider Last Rate Last Dose  . ipratropium-albuterol (DUONEB) 0.5-2.5 (3) MG/3ML nebulizer solution 3 mL  3 mL Nebulization Q6H Wendling, Crosby Oyster, DO        Past Surgical History:  Procedure Laterality Date  . EYE SURGERY Bilateral     No Known Allergies  Social History   Socioeconomic History  . Marital  status: Married    Spouse name: Not on file  . Number of children: Not on file  . Years of education: Not on file  . Highest education level: Not on file  Social Needs  . Financial resource strain: Not on file  . Food insecurity - worry: Not on file  . Food insecurity - inability: Not on file  . Transportation needs - medical: Not on file  . Transportation needs - non-medical: Not on file  Occupational History  . Not on file  Tobacco Use  . Smoking status: Light Tobacco Smoker    Packs/day: 0.10    Years: 25.00    Pack years: 2.50    Types: Cigarettes  . Smokeless tobacco: Never Used  Substance and Sexual Activity  . Alcohol use: No  . Drug use: No  . Sexual activity: Not on file  Other Topics Concern  . Not on file  Social History Narrative  . Not on file    Family History  Problem Relation Age of Onset  . Cancer Mother   . Dementia Mother   . Hypertension Mother   . Thyroid disease Mother   . Diabetes Father   . Heart disease Father   . Stroke Father   . Hypertension Father   . Diabetes Brother   . Depression Brother     BP 123/79   Pulse 81  Ht 5' 7"  (1.702 m)   Wt 242 lb (109.8 kg)   BMI 37.90 kg/m   Review of Systems: See HPI above.     Objective:  Physical Exam:  Gen: NAD, comfortable in exam room  Back: No gross deformity, scoliosis. TTP bilateral lumbar paraspinal regions.  No midline or bony TTP. ROM limited to 10 degrees extension, 45 flexion - both painful.  Pain on bilateral lateral rotations. Strength LEs 5/5 all muscle groups.   2+ MSRs in patellar and achilles tendons, equal bilaterally. Negative SLRs. Sensation intact to light touch bilaterally.  Bilateral hips: No deformity. FROM with 5/5 strength. No tenderness to palpation Negative logroll bilateral hips Negative fabers and piriformis stretches. NVI distally.   Assessment & Plan:  1. Low back pain radiating to both legs - concerning for spinal stenosis.  Will trial  prednisone dose pack and start physical therapy.  Norco, tizanidine as needed.  F/u in 1 month for reevaluation.

## 2017-08-14 NOTE — Assessment & Plan Note (Signed)
concerning for spinal stenosis.  Will trial prednisone dose pack and start physical therapy.  Norco, tizanidine as needed.  F/u in 1 month for reevaluation.

## 2017-09-08 ENCOUNTER — Encounter: Payer: Self-pay | Admitting: Family Medicine

## 2017-09-10 ENCOUNTER — Other Ambulatory Visit: Payer: Self-pay | Admitting: Family Medicine

## 2017-09-10 MED ORDER — SECUKINUMAB 150 MG/ML ~~LOC~~ SOAJ: 300.0000 mg | SUBCUTANEOUS | 1 refills | Status: DC

## 2017-09-10 NOTE — Progress Notes (Signed)
1 time order for monoclon ab called in.

## 2017-09-12 DIAGNOSIS — L4050 Arthropathic psoriasis, unspecified: Secondary | ICD-10-CM | POA: Diagnosis not present

## 2017-09-13 ENCOUNTER — Other Ambulatory Visit (HOSPITAL_BASED_OUTPATIENT_CLINIC_OR_DEPARTMENT_OTHER): Payer: Self-pay | Admitting: Nurse Practitioner

## 2017-09-13 ENCOUNTER — Ambulatory Visit (HOSPITAL_BASED_OUTPATIENT_CLINIC_OR_DEPARTMENT_OTHER)
Admission: RE | Admit: 2017-09-13 | Discharge: 2017-09-13 | Disposition: A | Discharge status: Home / Self Care | Payer: BLUE CROSS/BLUE SHIELD | Source: Ambulatory Visit | Attending: Nurse Practitioner | Admitting: Nurse Practitioner

## 2017-09-13 ENCOUNTER — Other Ambulatory Visit (INDEPENDENT_AMBULATORY_CARE_PROVIDER_SITE_OTHER): Payer: BLUE CROSS/BLUE SHIELD

## 2017-09-13 ENCOUNTER — Other Ambulatory Visit: Payer: Self-pay | Admitting: Family Medicine

## 2017-09-13 DIAGNOSIS — R531 Weakness: Secondary | ICD-10-CM | POA: Diagnosis not present

## 2017-09-13 DIAGNOSIS — R7611 Nonspecific reaction to tuberculin skin test without active tuberculosis: Secondary | ICD-10-CM | POA: Diagnosis not present

## 2017-09-13 DIAGNOSIS — L400 Psoriasis vulgaris: Secondary | ICD-10-CM | POA: Diagnosis not present

## 2017-09-13 LAB — COMPREHENSIVE METABOLIC PANEL
ALBUMIN: 3.8 g/dL (ref 3.5–5.2)
ALK PHOS: 77 U/L (ref 39–117)
ALT: 20 U/L (ref 0–53)
AST: 19 U/L (ref 0–37)
BUN: 16 mg/dL (ref 6–23)
CHLORIDE: 107 meq/L (ref 96–112)
CO2: 27 mEq/L (ref 19–32)
CREATININE: 0.81 mg/dL (ref 0.40–1.50)
Calcium: 8.5 mg/dL (ref 8.4–10.5)
GFR: 109.79 mL/min (ref >60.00)
Glucose, Bld: 101 mg/dL — ABNORMAL HIGH (ref 70–99)
POTASSIUM: 3.5 meq/L (ref 3.5–5.1)
SODIUM: 138 meq/L (ref 135–145)
TOTAL PROTEIN: 6.9 g/dL (ref 6.0–8.3)
Total Bilirubin: 0.6 mg/dL (ref 0.2–1.2)

## 2017-09-14 LAB — CBC WITH DIFFERENTIAL/PLATELET
BASOS ABS: 60 {cells}/uL (ref 0–200)
BASOS PCT: 0.8 %
EOS ABS: 525 {cells}/uL — AB (ref 15–500)
Eosinophils Relative: 7 %
HEMATOCRIT: 38.3 % — AB (ref 38.5–50.0)
HEMOGLOBIN: 13.3 g/dL (ref 13.2–17.1)
LYMPHS ABS: 2340 {cells}/uL (ref 850–3900)
MCH: 31.1 pg (ref 27.0–33.0)
MCHC: 34.7 g/dL (ref 32.0–36.0)
MCV: 89.5 fL (ref 80.0–100.0)
MONOS PCT: 11.8 %
MPV: 11.1 fL (ref 7.5–12.5)
NEUTROS ABS: 3690 {cells}/uL (ref 1500–7800)
Neutrophils Relative %: 49.2 %
Platelets: 249 10*3/uL (ref 140–400)
RBC: 4.28 10*6/uL (ref 4.20–5.80)
RDW: 13.5 % (ref 11.0–15.0)
Total Lymphocyte: 31.2 %
WBC: 7.5 10*3/uL (ref 3.8–10.8)
WBCMIX: 885 {cells}/uL (ref 200–950)

## 2017-09-14 LAB — HEPATITIS PANEL, ACUTE
HEP A IGM: NONREACTIVE
HEP B C IGM: NONREACTIVE
HEP B S AG: NONREACTIVE
HEP C AB: NONREACTIVE
SIGNAL TO CUT-OFF: 0.02 (ref <1.00)

## 2017-09-14 LAB — QUANTIFERON-TB GOLD PLUS
Mitogen-NIL: >10 IU/mL
NIL: 0.01 [IU]/mL
QuantiFERON-TB Gold Plus: POSITIVE — AB
TB1-NIL: 7.22 IU/mL
TB2-NIL: 7.61 IU/mL

## 2017-09-15 ENCOUNTER — Other Ambulatory Visit: Payer: Self-pay | Admitting: Family Medicine

## 2017-09-15 DIAGNOSIS — M6798 Unspecified disorder of synovium and tendon, other site: Secondary | ICD-10-CM

## 2017-09-15 DIAGNOSIS — M67951 Unspecified disorder of synovium and tendon, right thigh: Secondary | ICD-10-CM

## 2017-09-15 DIAGNOSIS — G8929 Other chronic pain: Secondary | ICD-10-CM

## 2017-09-15 DIAGNOSIS — M545 Low back pain: Principal | ICD-10-CM

## 2017-09-15 DIAGNOSIS — M67952 Unspecified disorder of synovium and tendon, left thigh: Secondary | ICD-10-CM

## 2017-09-16 ENCOUNTER — Ambulatory Visit: Payer: BLUE CROSS/BLUE SHIELD | Admitting: Family Medicine

## 2017-09-16 ENCOUNTER — Telehealth: Payer: Self-pay | Admitting: Family Medicine

## 2017-09-16 NOTE — Telephone Encounter (Signed)
Please disregard.  We have tried to call in and then to see how the assistance is set up and if it could be covered perhaps.  The dermatology team is taking over prescription of this medicine.  Thank you.

## 2017-09-16 NOTE — Telephone Encounter (Signed)
Noted  

## 2017-09-16 NOTE — Telephone Encounter (Signed)
Please advise 

## 2017-09-16 NOTE — Telephone Encounter (Signed)
Copied from [place]. Topic: Quick Communication - Rx Refill/Question >> Sep 16, 2017 10:30 AM Margot Ables wrote: Medication: Secukinumab (COSENTYX SENSOREADY PEN) 150 MG/ML Center name: [facility] Can be reached: [phone] Fax: [phone] Pharmacy: [facility]  Reason for call: needing clarification on dose and how many syringe to dispense - based on the written RX pt would need 2 prefilled for a 30 day supply - ok to call or fax   She states RX requires PA and asked if office completes paper or electronic (covermymeds)

## 2017-09-17 ENCOUNTER — Other Ambulatory Visit: Payer: Self-pay | Admitting: Family Medicine

## 2017-09-17 DIAGNOSIS — M545 Low back pain: Principal | ICD-10-CM

## 2017-09-17 DIAGNOSIS — M67952 Unspecified disorder of synovium and tendon, left thigh: Secondary | ICD-10-CM

## 2017-09-17 DIAGNOSIS — M6798 Unspecified disorder of synovium and tendon, other site: Secondary | ICD-10-CM

## 2017-09-17 DIAGNOSIS — M67951 Unspecified disorder of synovium and tendon, right thigh: Secondary | ICD-10-CM

## 2017-09-17 DIAGNOSIS — G8929 Other chronic pain: Secondary | ICD-10-CM

## 2017-09-23 ENCOUNTER — Other Ambulatory Visit: Payer: Self-pay | Admitting: Family Medicine

## 2017-09-23 MED ORDER — SECUKINUMAB 150 MG/ML ~~LOC~~ SOAJ: 300.0000 mg | SUBCUTANEOUS | 2 refills | Status: DC

## 2017-09-24 ENCOUNTER — Ambulatory Visit: Payer: BLUE CROSS/BLUE SHIELD | Admitting: Pulmonary Disease

## 2017-10-11 ENCOUNTER — Other Ambulatory Visit: Payer: Self-pay | Admitting: Family Medicine

## 2017-10-11 DIAGNOSIS — I10 Essential (primary) hypertension: Secondary | ICD-10-CM

## 2017-10-14 ENCOUNTER — Other Ambulatory Visit: Payer: Self-pay | Admitting: Family Medicine

## 2017-10-14 DIAGNOSIS — F419 Anxiety disorder, unspecified: Secondary | ICD-10-CM

## 2017-11-27 ENCOUNTER — Ambulatory Visit (HOSPITAL_BASED_OUTPATIENT_CLINIC_OR_DEPARTMENT_OTHER)
Admission: RE | Admit: 2017-11-27 | Discharge: 2017-11-27 | Disposition: A | Discharge status: Home / Self Care | Payer: BLUE CROSS/BLUE SHIELD | Source: Ambulatory Visit | Attending: Family Medicine | Admitting: Family Medicine

## 2017-11-27 ENCOUNTER — Ambulatory Visit: Payer: BLUE CROSS/BLUE SHIELD | Admitting: Family Medicine

## 2017-11-27 ENCOUNTER — Encounter: Payer: Self-pay | Admitting: Family Medicine

## 2017-11-27 VITALS — BP 122/80 | HR 78 | Temp 98.4°F | Ht 67.0 in | Wt 247.2 lb

## 2017-11-27 DIAGNOSIS — F419 Anxiety disorder, unspecified: Secondary | ICD-10-CM

## 2017-11-27 DIAGNOSIS — X58XXXA Exposure to other specified factors, initial encounter: Secondary | ICD-10-CM | POA: Diagnosis not present

## 2017-11-27 DIAGNOSIS — S6991XA Unspecified injury of right wrist, hand and finger(s), initial encounter: Secondary | ICD-10-CM

## 2017-11-27 DIAGNOSIS — M79645 Pain in left finger(s): Secondary | ICD-10-CM | POA: Diagnosis not present

## 2017-11-27 DIAGNOSIS — S6992XA Unspecified injury of left wrist, hand and finger(s), initial encounter: Secondary | ICD-10-CM | POA: Diagnosis not present

## 2017-11-27 NOTE — Patient Instructions (Addendum)
[facility] [place], [id number], [place] [address] [facility] [address] [place], [place] [address] [phone]  Call one of these offices sooner than later as it can take 2-3 months to get a new patient appointment.   Ice/cold pack over area for 10-15 min twice daily.  Triple antibiotic ointment twice daily over R hand for 10 days.  Things to look out for: increasing pain not relieved by ibuprofen/acetaminophen, fevers, spreading redness, drainage of pus, or foul odor.  Thumb spica splint- this can be helpful for L hand.  OK to take Tylenol 1000 mg (2 extra strength tabs) or 975 mg (3 regular strength tabs) every 6 hours as needed.  If you do not hear anything about your referral in the next 1-2 weeks, call our office and ask for an update.  Please consider counseling. Contact [phone] to schedule an appointment or inquire about cost/insurance coverage.

## 2017-11-27 NOTE — Progress Notes (Signed)
Pre visit review using our clinic review tool, if applicable. No additional management support is needed unless otherwise documented below in the visit note. 
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
## 2017-12-16 DIAGNOSIS — L4050 Arthropathic psoriasis, unspecified: Secondary | ICD-10-CM | POA: Diagnosis not present

## 2017-12-16 DIAGNOSIS — L400 Psoriasis vulgaris: Secondary | ICD-10-CM | POA: Diagnosis not present

## 2018-01-08 ENCOUNTER — Other Ambulatory Visit: Payer: Self-pay | Admitting: Family Medicine

## 2018-01-08 DIAGNOSIS — F419 Anxiety disorder, unspecified: Secondary | ICD-10-CM

## 2018-02-12 ENCOUNTER — Ambulatory Visit: Payer: BLUE CROSS/BLUE SHIELD | Admitting: Family Medicine

## 2018-02-12 ENCOUNTER — Encounter: Payer: Self-pay | Admitting: Family Medicine

## 2018-02-12 VITALS — BP 126/75 | HR 72 | Temp 98.6°F | Resp 16 | Ht 67.0 in | Wt 262.0 lb

## 2018-02-12 DIAGNOSIS — M79642 Pain in left hand: Secondary | ICD-10-CM

## 2018-02-12 DIAGNOSIS — M79641 Pain in right hand: Secondary | ICD-10-CM | POA: Diagnosis not present

## 2018-02-12 DIAGNOSIS — R42 Dizziness and giddiness: Secondary | ICD-10-CM | POA: Diagnosis not present

## 2018-02-12 DIAGNOSIS — G8929 Other chronic pain: Secondary | ICD-10-CM

## 2018-02-12 DIAGNOSIS — M545 Low back pain: Secondary | ICD-10-CM | POA: Diagnosis not present

## 2018-02-12 LAB — COMPREHENSIVE METABOLIC PANEL
ALBUMIN: 4.1 g/dL (ref 3.5–5.2)
ALT: 27 U/L (ref 0–53)
AST: 23 U/L (ref 0–37)
Alkaline Phosphatase: 82 U/L (ref 39–117)
BUN: 19 mg/dL (ref 6–23)
CHLORIDE: 105 meq/L (ref 96–112)
CO2: 26 mEq/L (ref 19–32)
Calcium: 9 mg/dL (ref 8.4–10.5)
Creatinine, Ser: 0.9 mg/dL (ref 0.40–1.50)
GFR: 97.04 mL/min (ref >60.00)
Glucose, Bld: 96 mg/dL (ref 70–99)
POTASSIUM: 3.5 meq/L (ref 3.5–5.1)
SODIUM: 140 meq/L (ref 135–145)
Total Bilirubin: 0.5 mg/dL (ref 0.2–1.2)
Total Protein: 7.3 g/dL (ref 6.0–8.3)

## 2018-02-12 LAB — CBC
HEMATOCRIT: 40.7 % (ref 39.0–52.0)
Hemoglobin: 13.4 g/dL (ref 13.0–17.0)
MCHC: 32.8 g/dL (ref 30.0–36.0)
MCV: 93.4 fl (ref 78.0–100.0)
Platelets: 248 10*3/uL (ref 150.0–400.0)
RBC: 4.36 Mil/uL (ref 4.22–5.81)
RDW: 15.1 % (ref 11.5–15.5)
WBC: 7.1 10*3/uL (ref 4.0–10.5)

## 2018-02-12 LAB — TSH: TSH: 1.93 u[IU]/mL (ref 0.35–4.50)

## 2018-02-12 MED ORDER — DULOXETINE HCL 60 MG PO CPEP: 60.0000 mg | ORAL_CAPSULE | Freq: Every day | ORAL | 3 refills | Status: DC

## 2018-02-12 MED ORDER — KETOROLAC TROMETHAMINE 60 MG/2ML IM SOLN
60.0000 mg | Freq: Once | INTRAMUSCULAR | Status: AC
  Administered 2018-02-12: 60 mg via INTRAMUSCULAR

## 2018-02-12 NOTE — Progress Notes (Signed)
Chief Complaint  Patient presents with  . Dizziness  . Fatigue  . pain all over    Michael Solomon is 47 y.o. pt here for dizziness.  Duration: 3 months Pass out? No; feels like he is going to pass out Describes as light headed, lasts for a few seconds.  Spinning? No Recent illness/fever? No Headache? No Neurologic signs? No Change in PO intake? No   Pain in low back, hands. Started 3 years ago, getting worse. Has a hx of psoriasis. Has not tried anything at home for this. Does not stretch or do exercise.  ROS:  Neuro: As noted in HPI Eyes: No vision changes  Past Medical History:  Diagnosis Date  . Anxiety   . Arthritis   . Arthritis   . Cataract   . Chronic kidney disease   . Depression   . Hypertension   . OSA (obstructive sleep apnea) 06/24/2017  . Plaque psoriasis     Family History  Problem Relation Age of Onset  . Cancer Mother   . Dementia Mother   . Hypertension Mother   . Thyroid disease Mother   . Diabetes Father   . Heart disease Father   . Stroke Father   . Hypertension Father   . Diabetes Brother   . Depression Brother     Allergies as of 02/12/2018   No Known Allergies     Medication List        Accurate as of 02/12/18 12:57 PM. Always use your most recent med list.          ammonium lactate 12 % lotion Commonly known as:  LAC-HYDRIN APPLY TO THE AFFECTED AREAS OF SKIN BID PRN   clonazePAM 0.5 MG tablet Commonly known as:  KLONOPIN TAKE 1 TABLET(0.5 MG) BY MOUTH TWICE DAILY AS NEEDED FOR ANXIETY   DULoxetine 60 MG capsule Commonly known as:  CYMBALTA Take 1 capsule (60 mg total) by mouth daily.   lisinopril 20 MG tablet Commonly known as:  PRINIVIL,ZESTRIL TAKE 1 TABLET BY MOUTH DAILY   tadalafil 5 MG tablet Commonly known as:  CIALIS TAKE 1 TABLET(5 MG) BY MOUTH DAILY AS NEEDED FOR ERECTILE DYSFUNCTION   TALZENNA PO Take by mouth.       BP 126/75   Pulse 72   Temp 98.6 F (37 C) (Oral)   Resp 16   Ht 5' 7"   (1.702 m)   Wt 262 lb (118.8 kg)   SpO2 99%   BMI 41.04 kg/m  General: Awake, alert, appears stated age Eyes: PERRLA, EOMi Ears: Patent, TM's neg b/l Heart: RRR, no carotid bruits Lungs: CTAB, no accessory muscle use MSK: 5/5 strength throughout, gait normal Neuro: No cerebellar signs, patellar reflex 3/4 b/l wo clonus, calcaneal reflex 2/4 b/l wo clonus, biceps reflex 2/4 b/l wo clonus Psych: Age appropriate judgment and insight, normal mood and affect  Dizziness - Plan: EKG 12-Lead, CBC, Comprehensive metabolic panel, TSH, CANCELED: EKG 12-Lead  Pain in both hands - Plan: ketorolac (TORADOL) injection 60 mg  Chronic bilateral low back pain without sciatica - Plan: ketorolac (TORADOL) injection 60 mg  EKG neg. Ck labs. If nml, will refer to cardiology for presyncope. Trial NSAIDs and Tylenol for pain. If no improvement, will trial PO steroids as psoriatic arthritis is on differential. Stretches/exercises given. Heat.  F/u prn. Pt voiced understanding and agreement to the plan.  [place], DO 02/12/18 12:57 PM

## 2018-02-12 NOTE — Patient Instructions (Addendum)
Stay hydrated. If your labs are normal, I will be sending you to a heart doctor.  Heat (pad or rice pillow in microwave) over affected area, 10-15 minutes twice daily.   OK to take Tylenol 1000 mg (2 extra strength tabs) or 975 mg (3 regular strength tabs) every 6 hours as needed.  Ibuprofen 400-600 mg (2-3 over the counter strength tabs) every 6 hours as needed for pain.  CBD oil can be helpful for pain also.  EXERCISES  RANGE OF MOTION (ROM) AND STRETCHING EXERCISES - Low Back Pain Most people with lower back pain will find that their symptoms get worse with excessive bending forward (flexion) or arching at the lower back (extension). The exercises that will help resolve your symptoms will focus on the opposite motion.  If you have pain, numbness or tingling which travels down into your buttocks, leg or foot, the goal of the therapy is for these symptoms to move closer to your back and eventually resolve. Sometimes, these leg symptoms will get better, but your lower back pain may worsen. This is often an indication of progress in your rehabilitation. Be very alert to any changes in your symptoms and the activities in which you participated in the 24 hours prior to the change. Sharing this information with your caregiver will allow him or her to most efficiently treat your condition. These exercises may help you when beginning to rehabilitate your injury. Your symptoms may resolve with or without further involvement from your physician, physical therapist or athletic trainer. While completing these exercises, remember:   Restoring tissue flexibility helps normal motion to return to the joints. This allows healthier, less painful movement and activity.  An effective stretch should be held for at least 30 seconds.  A stretch should never be painful. You should only feel a gentle lengthening or release in the stretched tissue. FLEXION RANGE OF MOTION AND STRETCHING EXERCISES:  STRETCH - Flexion,  Single Knee to Chest   Lie on a firm bed or floor with both legs extended in front of you.  Keeping one leg in contact with the floor, bring your opposite knee to your chest. Hold your leg in place by either grabbing behind your thigh or at your knee.  Pull until you feel a gentle stretch in your low back. Hold 30 seconds.  Slowly release your grasp and repeat the exercise with the opposite side. Repeat 2 times. Complete this exercise 3 times per week.   STRETCH - Flexion, Double Knee to Chest  Lie on a firm bed or floor with both legs extended in front of you.  Keeping one leg in contact with the floor, bring your opposite knee to your chest.  Tense your stomach muscles to support your back and then lift your other knee to your chest. Hold your legs in place by either grabbing behind your thighs or at your knees.  Pull both knees toward your chest until you feel a gentle stretch in your low back. Hold 30 seconds.  Tense your stomach muscles and slowly return one leg at a time to the floor. Repeat 2 times. Complete this exercise 3 times per week.   STRETCH - Low Trunk Rotation  Lie on a firm bed or floor. Keeping your legs in front of you, bend your knees so they are both pointed toward the ceiling and your feet are flat on the floor.  Extend your arms out to the side. This will stabilize your upper body by keeping your  shoulders in contact with the floor.  Gently and slowly drop both knees together to one side until you feel a gentle stretch in your low back. Hold for 30 seconds.  Tense your stomach muscles to support your lower back as you bring your knees back to the starting position. Repeat the exercise to the other side. Repeat 2 times. Complete this exercise at least 3 times per week.   EXTENSION RANGE OF MOTION AND FLEXIBILITY EXERCISES:  STRETCH - Extension, Prone on Elbows   Lie on your stomach on the floor, a bed will be too soft. Place your palms about shoulder  width apart and at the height of your head.  Place your elbows under your shoulders. If this is too painful, stack pillows under your chest.  Allow your body to relax so that your hips drop lower and make contact more completely with the floor.  Hold this position for 30 seconds.  Slowly return to lying flat on the floor. Repeat 2 times. Complete this exercise 3 times per week.   RANGE OF MOTION - Extension, Prone Press Ups  Lie on your stomach on the floor, a bed will be too soft. Place your palms about shoulder width apart and at the height of your head.  Keeping your back as relaxed as possible, slowly straighten your elbows while keeping your hips on the floor. You may adjust the placement of your hands to maximize your comfort. As you gain motion, your hands will come more underneath your shoulders.  Hold this position 30 seconds.  Slowly return to lying flat on the floor. Repeat 2 times. Complete this exercise 3 times per week.   RANGE OF MOTION- Quadruped, Neutral Spine   Assume a hands and knees position on a firm surface. Keep your hands under your shoulders and your knees under your hips. You may place padding under your knees for comfort.  Drop your head and point your tailbone toward the ground below you. This will round out your lower back like an angry cat. Hold this position for 30 seconds.  Slowly lift your head and release your tail bone so that your back sags into a large arch, like an old horse.  Hold this position for 30 seconds.  Repeat this until you feel limber in your low back.  Now, find your "sweet spot." This will be the most comfortable position somewhere between the two previous positions. This is your neutral spine. Once you have found this position, tense your stomach muscles to support your low back.  Hold this position for 30 seconds. Repeat 2 times. Complete this exercise 3 times per week.   STRENGTHENING EXERCISES - Low Back Sprain These  exercises may help you when beginning to rehabilitate your injury. These exercises should be done near your "sweet spot." This is the neutral, low-back arch, somewhere between fully rounded and fully arched, that is your least painful position. When performed in this safe range of motion, these exercises can be used for people who have either a flexion or extension based injury. These exercises may resolve your symptoms with or without further involvement from your physician, physical therapist or athletic trainer. While completing these exercises, remember:   Muscles can gain both the endurance and the strength needed for everyday activities through controlled exercises.  Complete these exercises as instructed by your physician, physical therapist or athletic trainer. Increase the resistance and repetitions only as guided.  You may experience muscle soreness or fatigue, but the pain  or discomfort you are trying to eliminate should never worsen during these exercises. If this pain does worsen, stop and make certain you are following the directions exactly. If the pain is still present after adjustments, discontinue the exercise until you can discuss the trouble with your caregiver.  STRENGTHENING - Deep Abdominals, Pelvic Tilt   Lie on a firm bed or floor. Keeping your legs in front of you, bend your knees so they are both pointed toward the ceiling and your feet are flat on the floor.  Tense your lower abdominal muscles to press your low back into the floor. This motion will rotate your pelvis so that your tail bone is scooping upwards rather than pointing at your feet or into the floor. With a gentle tension and even breathing, hold this position for 3 seconds. Repeat 2 times. Complete this exercise 3 times per week.   STRENGTHENING - Abdominals, Crunches   Lie on a firm bed or floor. Keeping your legs in front of you, bend your knees so they are both pointed toward the ceiling and your feet are  flat on the floor. Cross your arms over your chest.  Slightly tip your chin down without bending your neck.  Tense your abdominals and slowly lift your trunk high enough to just clear your shoulder blades. Lifting higher can put excessive stress on the lower back and does not further strengthen your abdominal muscles.  Control your return to the starting position. Repeat 2 times. Complete this exercise 3 times per week.   STRENGTHENING - Quadruped, Opposite UE/LE Lift   Assume a hands and knees position on a firm surface. Keep your hands under your shoulders and your knees under your hips. You may place padding under your knees for comfort.  Find your neutral spine and gently tense your abdominal muscles so that you can maintain this position. Your shoulders and hips should form a rectangle that is parallel with the floor and is not twisted.  Keeping your trunk steady, lift your right hand no higher than your shoulder and then your left leg no higher than your hip. Make sure you are not holding your breath. Hold this position for 30 seconds.  Continuing to keep your abdominal muscles tense and your back steady, slowly return to your starting position. Repeat with the opposite arm and leg. Repeat 2 times. Complete this exercise 3 times per week.   STRENGTHENING - Abdominals and Quadriceps, Straight Leg Raise   Lie on a firm bed or floor with both legs extended in front of you.  Keeping one leg in contact with the floor, bend the other knee so that your foot can rest flat on the floor.  Find your neutral spine, and tense your abdominal muscles to maintain your spinal position throughout the exercise.  Slowly lift your straight leg off the floor about 6 inches for a count of 3, making sure to not hold your breath.  Still keeping your neutral spine, slowly lower your leg all the way to the floor. Repeat this exercise with each leg 2 times. Complete this exercise 3 times per  week.  POSTURE AND BODY MECHANICS CONSIDERATIONS - Low Back Sprain Keeping correct posture when sitting, standing or completing your activities will reduce the stress put on different body tissues, allowing injured tissues a chance to heal and limiting painful experiences. The following are general guidelines for improved posture.  While reading these guidelines, remember:  The exercises prescribed by your provider will help you  have the flexibility and strength to maintain correct postures.  The correct posture provides the best environment for your joints to work. All of your joints have less wear and tear when properly supported by a spine with good posture. This means you will experience a healthier, less painful body.  Correct posture must be practiced with all of your activities, especially prolonged sitting and standing. Correct posture is as important when doing repetitive low-stress activities (typing) as it is when doing a single heavy-load activity (lifting).  RESTING POSITIONS Consider which positions are most painful for you when choosing a resting position. If you have pain with flexion-based activities (sitting, bending, stooping, squatting), choose a position that allows you to rest in a less flexed posture. You would want to avoid curling into a fetal position on your side. If your pain worsens with extension-based activities (prolonged standing, working overhead), avoid resting in an extended position such as sleeping on your stomach. Most people will find more comfort when they rest with their spine in a more neutral position, neither too rounded nor too arched. Lying on a non-sagging bed on your side with a pillow between your knees, or on your back with a pillow under your knees will often provide some relief. Keep in mind, being in any one position for a prolonged period of time, no matter how correct your posture, can still lead to stiffness.  PROPER SITTING POSTURE In order to  minimize stress and discomfort on your spine, you must sit with correct posture. Sitting with good posture should be effortless for a healthy body. Returning to good posture is a gradual process. Many people can work toward this most comfortably by using various supports until they have the flexibility and strength to maintain this posture on their own. When sitting with proper posture, your ears will fall over your shoulders and your shoulders will fall over your hips. You should use the back of the chair to support your upper back. Your lower back will be in a neutral position, just slightly arched. You may place a small pillow or folded towel at the base of your lower back for  support.  When working at a desk, create an environment that supports good, upright posture. Without extra support, muscles tire, which leads to excessive strain on joints and other tissues. Keep these recommendations in mind:  CHAIR:  A chair should be able to slide under your desk when your back makes contact with the back of the chair. This allows you to work closely.  The chair's height should allow your eyes to be level with the upper part of your monitor and your hands to be slightly lower than your elbows.  BODY POSITION  Your feet should make contact with the floor. If this is not possible, use a foot rest.  Keep your ears over your shoulders. This will reduce stress on your neck and low back.  INCORRECT SITTING POSTURES  If you are feeling tired and unable to assume a healthy sitting posture, do not slouch or slump. This puts excessive strain on your back tissues, causing more damage and pain. Healthier options include:  Using more support, like a lumbar pillow.  Switching tasks to something that requires you to be upright or walking.  Talking a brief walk.  Lying down to rest in a neutral-spine position.  PROLONGED STANDING WHILE SLIGHTLY LEANING FORWARD  When completing a task that requires you to  lean forward while standing in one place for a  long time, place either foot up on a stationary 2-4 inch high object to help maintain the best posture. When both feet are on the ground, the lower back tends to lose its slight inward curve. If this curve flattens (or becomes too large), then the back and your other joints will experience too much stress, tire more quickly, and can cause pain.  CORRECT STANDING POSTURES Proper standing posture should be assumed with all daily activities, even if they only take a few moments, like when brushing your teeth. As in sitting, your ears should fall over your shoulders and your shoulders should fall over your hips. You should keep a slight tension in your abdominal muscles to brace your spine. Your tailbone should point down to the ground, not behind your body, resulting in an over-extended swayback posture.   INCORRECT STANDING POSTURES  Common incorrect standing postures include a forward head, locked knees and/or an excessive swayback. WALKING Walk with an upright posture. Your ears, shoulders and hips should all line-up.  PROLONGED ACTIVITY IN A FLEXED POSITION When completing a task that requires you to bend forward at your waist or lean over a low surface, try to find a way to stabilize 3 out of 4 of your limbs. You can place a hand or elbow on your thigh or rest a knee on the surface you are reaching across. This will provide you more stability, so that your muscles do not tire as quickly. By keeping your knees relaxed, or slightly bent, you will also reduce stress across your lower back. CORRECT LIFTING TECHNIQUES  DO :  Assume a wide stance. This will provide you more stability and the opportunity to get as close as possible to the object which you are lifting.  Tense your abdominals to brace your spine. Bend at the knees and hips. Keeping your back locked in a neutral-spine position, lift using your leg muscles. Lift with your legs, keeping your  back straight.  Test the weight of unknown objects before attempting to lift them.  Try to keep your elbows locked down at your sides in order get the best strength from your shoulders when carrying an object.     Always ask for help when lifting heavy or awkward objects. INCORRECT LIFTING TECHNIQUES DO NOT:   Lock your knees when lifting, even if it is a small object.  Bend and twist. Pivot at your feet or move your feet when needing to change directions.  Assume that you can safely pick up even a paperclip without proper posture.

## 2018-02-14 ENCOUNTER — Encounter: Payer: Self-pay | Admitting: Family Medicine

## 2018-02-14 ENCOUNTER — Other Ambulatory Visit: Payer: Self-pay | Admitting: Family Medicine

## 2018-02-14 DIAGNOSIS — R42 Dizziness and giddiness: Secondary | ICD-10-CM

## 2018-02-14 DIAGNOSIS — R55 Syncope and collapse: Principal | ICD-10-CM

## 2018-02-15 ENCOUNTER — Other Ambulatory Visit: Payer: Self-pay | Admitting: Family Medicine

## 2018-02-15 DIAGNOSIS — I10 Essential (primary) hypertension: Secondary | ICD-10-CM

## 2018-02-26 ENCOUNTER — Ambulatory Visit: Payer: BLUE CROSS/BLUE SHIELD | Admitting: Family Medicine

## 2018-02-27 ENCOUNTER — Encounter: Payer: Self-pay | Admitting: Family Medicine

## 2018-02-28 ENCOUNTER — Ambulatory Visit: Payer: BLUE CROSS/BLUE SHIELD | Admitting: Family Medicine

## 2018-02-28 ENCOUNTER — Encounter: Payer: Self-pay | Admitting: Family Medicine

## 2018-02-28 VITALS — BP 110/88 | HR 76 | Temp 98.6°F | Ht 67.0 in | Wt 261.0 lb

## 2018-02-28 DIAGNOSIS — R300 Dysuria: Secondary | ICD-10-CM

## 2018-02-28 DIAGNOSIS — L400 Psoriasis vulgaris: Secondary | ICD-10-CM | POA: Diagnosis not present

## 2018-02-28 LAB — POC URINALSYSI DIPSTICK (AUTOMATED)
Bilirubin, UA: NEGATIVE
Blood, UA: NEGATIVE
Glucose, UA: NEGATIVE
KETONES UA: NEGATIVE
LEUKOCYTES UA: NEGATIVE
Nitrite, UA: NEGATIVE
PROTEIN UA: NEGATIVE
Spec Grav, UA: 1.025 (ref 1.010–1.025)
Urobilinogen, UA: NEGATIVE E.U./dL — AB
pH, UA: 6 (ref 5.0–8.0)

## 2018-02-28 MED ORDER — METHYLPREDNISOLONE ACETATE 80 MG/ML IJ SUSP
80.0000 mg | Freq: Once | INTRAMUSCULAR | Status: AC
  Administered 2018-02-28: 80 mg via INTRAMUSCULAR

## 2018-02-28 MED ORDER — PREDNISONE 20 MG PO TABS: 40.0000 mg | ORAL_TABLET | Freq: Every day | ORAL | 0 refills | Status: AC

## 2018-02-28 NOTE — Patient Instructions (Signed)
Start the prednisone tomorrow.   We will be in contact regarding your urine culture in the next 2-3 days.  Stay hydrated.  Let us know if you need anything.

## 2018-02-28 NOTE — Progress Notes (Signed)
Chief Complaint  Patient presents with  . Leg Pain  . Dysuria    Michael Solomon is a 47 y.o. male here for burning with urination.  Duration: 3 days. Symptoms: urinary frequency and dysuria Denies: hematuria, urinary retention, fever, nausea Hx of recurrent UTI? No  He is not circumcised. Denies new sexual partners.   Hx of plaque psoriasis. Tx'd by derm team, on a different biologic that is not helping since the change. Has been having burning and scaling. No drainage anywhere. Some swelling. Denies fevers.  ROS:  Constitutional: denies fever GU: As noted in HPI  Past Medical History:  Diagnosis Date  . Anxiety   . Arthritis   . Arthritis   . Cataract   . Chronic kidney disease   . Depression   . Hypertension   . OSA (obstructive sleep apnea) 06/24/2017  . Plaque psoriasis     BP 110/88 (BP Location: Left Arm, Patient Position: Sitting, Cuff Size: Large)   Pulse 76   Temp 98.6 F (37 C) (Oral)   Ht 5' 7"  (1.702 m)   Wt 261 lb (118.4 kg)   SpO2 93%   BMI 40.88 kg/m  General: Awake, alert, appears stated age Heart: RRR Lungs: CTAB, normal respiratory effort, no accessory muscle usage Abd: BS+, soft, mild ttp over suprapubic region, ND, no masses or organomegaly MSK: No CVA tenderness, neg Lloyd's sign Skin: Severe scaling over redness on b/l LE's. No drainage, fluctuance, excessive warmth noted.  Psych: Age appropriate judgment and insight  Dysuria - Plan: POCT Urinalysis Dipstick (Automated), Urine Culture  Plaque psoriasis - Plan: predniSONE (DELTASONE) 20 MG tablet, methylPREDNISolone acetate (DEPO-MEDROL) injection 80 mg  Orders as above. Ck culture. Stay hydrated. Seek immediate care if pt starts to develop fevers, new/worsening symptoms, uncontrollable N/V. Steroid injection today, start tabs tomorrow to calm down skin issues. F/u with Derm team for further management of this.  F/u prn. The patient voiced understanding and agreement to the  plan.  [place], DO 02/28/18 1:53 PM

## 2018-02-28 NOTE — Progress Notes (Signed)
Pre visit review using our clinic review tool, if applicable. No additional management support is needed unless otherwise documented below in the visit note. 

## 2018-03-01 LAB — URINE CULTURE
MICRO NUMBER:: 90886979
RESULT: NO GROWTH
SPECIMEN QUALITY:: ADEQUATE

## 2018-03-07 ENCOUNTER — Encounter: Payer: Self-pay | Admitting: Family Medicine

## 2018-03-07 ENCOUNTER — Other Ambulatory Visit: Payer: Self-pay | Admitting: Family Medicine

## 2018-03-07 MED ORDER — MELOXICAM 15 MG PO TABS: 15.0000 mg | ORAL_TABLET | Freq: Every day | ORAL | 0 refills | Status: DC

## 2018-03-07 MED ORDER — PAROXETINE HCL 40 MG PO TABS: 40.0000 mg | ORAL_TABLET | ORAL | 3 refills | Status: DC

## 2018-03-07 MED ORDER — HYDROCODONE-ACETAMINOPHEN 5-325 MG PO TABS: 1.0000 | ORAL_TABLET | Freq: Three times a day (TID) | ORAL | 0 refills | Status: DC | PRN

## 2018-03-25 ENCOUNTER — Ambulatory Visit: Payer: BLUE CROSS/BLUE SHIELD | Admitting: Cardiology

## 2018-03-27 ENCOUNTER — Encounter: Payer: Self-pay | Admitting: Family Medicine

## 2018-03-28 ENCOUNTER — Ambulatory Visit: Payer: BLUE CROSS/BLUE SHIELD | Admitting: Cardiology

## 2018-03-28 ENCOUNTER — Encounter: Payer: Self-pay | Admitting: Cardiology

## 2018-03-28 ENCOUNTER — Other Ambulatory Visit: Payer: Self-pay | Admitting: Family Medicine

## 2018-03-28 VITALS — BP 114/64 | HR 93 | Ht 67.0 in | Wt 264.8 lb

## 2018-03-28 DIAGNOSIS — G4733 Obstructive sleep apnea (adult) (pediatric): Secondary | ICD-10-CM | POA: Diagnosis not present

## 2018-03-28 DIAGNOSIS — E663 Overweight: Secondary | ICD-10-CM

## 2018-03-28 DIAGNOSIS — R079 Chest pain, unspecified: Secondary | ICD-10-CM

## 2018-03-28 DIAGNOSIS — F419 Anxiety disorder, unspecified: Secondary | ICD-10-CM

## 2018-03-28 MED ORDER — CLONAZEPAM 0.5 MG PO TABS: ORAL_TABLET | ORAL | 2 refills | Status: DC

## 2018-03-28 NOTE — Progress Notes (Signed)
Cardiology Office Note:    Date:  03/28/2018   ID:  Jaspal Pultz, DOB September 20, 1970, MRN [id number]  PCP:  Shelda Pal, DO  Cardiologist:  Jenean Lindau, MD   Referring MD: Shelda Pal*    ASSESSMENT:    1. Chest pain, unspecified type   2. OSA (obstructive sleep apnea)   3. Overweight    PLAN:    In order of problems listed above:  1. Primary prevention stressed with the patient.  Importance of compliance with diet and medications stressed and he vocalized understanding.  Diet was discussed for obesity and weight reduction was stressed.  Sleep health issues were discussed.  Lipids were reviewed with him at length. 2. In view of his symptoms I recommended exercise stress echo and he is agreeable on this.  His chest pain symptoms are a bit atypical for coronary artery disease. 3. Patient will be seen in follow-up appointment in 6 months or earlier if the patient has any concerns    Medication Adjustments/Labs and Tests Ordered: Current medicines are reviewed at length with the patient today.  Concerns regarding medicines are outlined above.  No orders of the defined types were placed in this encounter.  No orders of the defined types were placed in this encounter.    History of Present Illness:    Michael Solomon is a 47 y.o. male who is being seen today for the evaluation of chest pain at the request of Shelda Pal*.  Patient is a pleasant 47 year old male.  He has past medical history which is not very significant.  I am not really sure if the patient has essential hypertension.  He mentions to me that he occasionally has a tingling-like sensation in the chest.  No orthopnea or PND.  With activities of daily living he does not have any issues.  He is very active at work.  He does not exercise and leads a sedentary lifestyle.  He is overweight.  His wife accompanies him for this visit.  Past Medical History:  Diagnosis Date  . Anxiety   .  Arthritis   . Arthritis   . Cataract   . Chronic kidney disease   . Depression   . Hypertension   . OSA (obstructive sleep apnea) 06/24/2017  . Plaque psoriasis     Past Surgical History:  Procedure Laterality Date  . EYE SURGERY Bilateral     Current Medications: Current Meds  Medication Sig  . ammonium lactate (LAC-HYDRIN) 12 % lotion APPLY TO THE AFFECTED AREAS OF SKIN BID PRN  . clonazePAM (KLONOPIN) 0.5 MG tablet TAKE 1 TABLET(0.5 MG) BY MOUTH TWICE DAILY AS NEEDED FOR ANXIETY  . lisinopril (PRINIVIL,ZESTRIL) 20 MG tablet TAKE 1 TABLET BY MOUTH DAILY  . meloxicam (MOBIC) 15 MG tablet TAKE 1 TABLET(15 MG) BY MOUTH DAILY  . PARoxetine (PAXIL) 40 MG tablet Take 1 tablet (40 mg total) by mouth every morning.  . tadalafil (CIALIS) 5 MG tablet TAKE 1 TABLET(5 MG) BY MOUTH DAILY AS NEEDED FOR ERECTILE DYSFUNCTION  . Talazoparib Tosylate (TALZENNA PO) Take by mouth.  Marland Kitchen TALTZ 80 MG/ML SOAJ      Allergies:   Patient has no known allergies.   Social History   Socioeconomic History  . Marital status: Married    Spouse name: Not on file  . Number of children: Not on file  . Years of education: Not on file  . Highest education level: Not on file  Occupational History  . Not  on file  Social Needs  . Financial resource strain: Not on file  . Food insecurity:    Worry: Not on file    Inability: Not on file  . Transportation needs:    Medical: Not on file    Non-medical: Not on file  Tobacco Use  . Smoking status: Light Tobacco Smoker    Packs/day: 0.10    Years: 25.00    Pack years: 2.50    Types: Cigarettes  . Smokeless tobacco: Never Used  Substance and Sexual Activity  . Alcohol use: No  . Drug use: No  . Sexual activity: Not on file  Lifestyle  . Physical activity:    Days per week: Not on file    Minutes per session: Not on file  . Stress: Not on file  Relationships  . Social connections:    Talks on phone: Not on file    Gets together: Not on file     Attends religious service: Not on file    Active member of club or organization: Not on file    Attends meetings of clubs or organizations: Not on file    Relationship status: Not on file  Other Topics Concern  . Not on file  Social History Narrative  . Not on file     Family History: The patient's family history includes Cancer in his mother; Dementia in his mother; Depression in his brother; Diabetes in his brother and father; Heart disease in his father; Hypertension in his father and mother; Stroke in his father; Thyroid disease in his mother.  ROS:   Please see the history of present illness.    All other systems reviewed and are negative.  EKGs/Labs/Other Studies Reviewed:    The following studies were reviewed today: EKG reveals sinus rhythm and nonspecific ST-T changes.  Lipids are unremarkable.   Recent Labs: 02/12/2018: ALT 27; BUN 19; Creatinine, Ser 0.90; Hemoglobin 13.4; Platelets 248.0; Potassium 3.5; Sodium 140; TSH 1.93  Recent Lipid Panel    Component Value Date/Time   CHOL 149 03/18/2017 1047   TRIG 72.0 03/18/2017 1047   HDL 38.60 (L) 03/18/2017 1047   CHOLHDL 4 03/18/2017 1047   VLDL 14.4 03/18/2017 1047   LDLCALC 96 03/18/2017 1047    Physical Exam:    VS:  BP 114/64   Pulse 93   Ht 5' 7"  (1.702 m)   Wt 264 lb 12.8 oz (120.1 kg)   SpO2 93%   BMI 41.47 kg/m     Wt Readings from Last 3 Encounters:  03/28/18 264 lb 12.8 oz (120.1 kg)  02/28/18 261 lb (118.4 kg)  02/12/18 262 lb (118.8 kg)     GEN: Patient is in no acute distress HEENT: Normal NECK: No JVD; No carotid bruits LYMPHATICS: No lymphadenopathy CARDIAC: S1 S2 regular, 2/6 systolic murmur at the apex. RESPIRATORY:  Clear to auscultation without rales, wheezing or rhonchi  ABDOMEN: Soft, non-tender, non-distended MUSCULOSKELETAL:  No edema; No deformity  SKIN: Warm and dry NEUROLOGIC:  Alert and oriented x 3 PSYCHIATRIC:  Normal affect    Signed, Jenean Lindau, MD    03/28/2018 9:19 AM    [place]

## 2018-03-28 NOTE — Patient Instructions (Signed)
Medication Instructions:  Your physician recommends that you continue on your current medications as directed. Please refer to the Current Medication list given to you today.   Labwork: None.  Testing/Procedures: Your physician has requested that you have a stress echocardiogram. For further information please visit [email or web address]. Please follow instruction sheet as given.   Follow-Up: Your physician wants you to follow-up in: 6 months. You will receive a reminder letter in the mail two months in advance. If you don't receive a letter, please call our office to schedule the follow-up appointment.   Any Other Special Instructions Will Be Listed Below (If Applicable).     If you need a refill on your cardiac medications before your next appointment, please call your pharmacy.   Exercise Stress Echocardiogram An exercise stress echocardiogram is a test to check how well your heart is working. This test uses sound waves (ultrasound) and a computer to make images of your heart before and after exercise. Ultrasound images that are taken before you exercise (your resting echocardiogram) will show how much blood is getting to your heart muscle and how well your heart muscle and heart valves are functioning. During the next part of this test, you will walk on a treadmill or ride a stationary bike to see how exercise affects your heart. While you exercise, the electrical activity of your heart will be monitored with an electrocardiogram (ECG). Your blood pressure will also be monitored. You may have this test if you:  Have chest pain or other symptoms of a heart problem.  Recently had a heart attack or heart surgery.  Have heart valve problems.  Have a condition that causes narrowing of the blood vessels that supply your heart (coronary artery disease).  Have a high risk of heart disease and are starting a new exercise program.  Have a high risk of heart disease and need to have  major surgery.  Tell a health care provider about:  Any allergies you have.  All medicines you are taking, including vitamins, herbs, eye drops, creams, and over-the-counter medicines.  Any problems you or family members have had with anesthetic medicines.  Any blood disorders you have.  Any surgeries you have had.  Any medical conditions you have.  Whether you are pregnant or may be pregnant. What are the risks? Generally, this is a safe procedure. However, problems may occur, including:  Chest pain.  Dizziness or light-headedness.  Shortness of breath.  Increased or irregular heartbeat (palpitations).  Nausea or vomiting.  Heart attack (very rare).  What happens before the procedure?  Follow instructions from your health care provider about eating or drinking restrictions. You may be asked to avoid all forms of caffeine for 24 hours before your procedure, or as told by your health care provider.  Ask your health care provider about changing or stopping your regular medicines. This is especially important if you are taking diabetes medicines or blood thinners.  If you use an inhaler, bring it with you to the test.  Wear loose, comfortable clothing and walking shoes.  Do notuse any products that contain nicotine or tobacco, such as cigarettes and e-cigarettes, for 4 hours before the test or as told by your health care provider. If you need help quitting, ask your health care provider. What happens during the procedure?  You will take off your clothes from the waist up and put on a hospital gown.  A technician will place electrodes on your chest.  A blood pressure cuff will  be placed on your arm.  You will lie down on a table for an ultrasound exam before you exercise. Gel will be rubbed on your chest, and a handheld device (transducer) will be pressed against your chest and moved over your heart.  Then, you will start exercising by walking on a treadmill or  pedaling a stationary bicycle.  Your blood pressure and heart rhythm will be monitored while you exercise.  The exercise will gradually get harder or faster.  You will exercise until: ? Your heart reaches a target level. ? You are too tired to continue. ? You cannot continue because of chest pain, weakness, or dizziness.  You will have another ultrasound exam after you stop exercising. The procedure may vary among health care providers and hospitals. What happens after the procedure?  Your heart rate and blood pressure will be monitored until they return to your normal levels. Summary  An exercise stress echocardiogram is a test that uses ultrasound to check how well your heart works before and after exercise.  Before the test, follow instructions from your health care provider about stopping medications, avoiding nicotine and tobacco, and avoiding certain foods and drinks.  During the test, your blood pressure and heart rhythm will be monitored while you exercise on a treadmill or stationary bicycle. This information is not intended to replace advice given to you by your health care provider. Make sure you discuss any questions you have with your health care provider. Document Released: 07/27/2004 Document Revised: 03/14/2016 Document Reviewed: 03/14/2016 Elsevier Interactive Patient Education  2018 [employer].

## 2018-03-31 DIAGNOSIS — L409 Psoriasis, unspecified: Secondary | ICD-10-CM | POA: Diagnosis not present

## 2018-03-31 DIAGNOSIS — L400 Psoriasis vulgaris: Secondary | ICD-10-CM | POA: Diagnosis not present

## 2018-03-31 DIAGNOSIS — D485 Neoplasm of uncertain behavior of skin: Secondary | ICD-10-CM | POA: Diagnosis not present

## 2018-03-31 DIAGNOSIS — L4050 Arthropathic psoriasis, unspecified: Secondary | ICD-10-CM | POA: Diagnosis not present

## 2018-04-01 ENCOUNTER — Ambulatory Visit (HOSPITAL_BASED_OUTPATIENT_CLINIC_OR_DEPARTMENT_OTHER)
Admission: RE | Admit: 2018-04-01 | Discharge: 2018-04-01 | Disposition: A | Discharge status: Home / Self Care | Payer: BLUE CROSS/BLUE SHIELD | Source: Ambulatory Visit | Attending: Cardiology | Admitting: Cardiology

## 2018-04-01 DIAGNOSIS — I10 Essential (primary) hypertension: Secondary | ICD-10-CM | POA: Diagnosis not present

## 2018-04-01 DIAGNOSIS — G4733 Obstructive sleep apnea (adult) (pediatric): Secondary | ICD-10-CM | POA: Diagnosis not present

## 2018-04-01 DIAGNOSIS — E663 Overweight: Secondary | ICD-10-CM | POA: Diagnosis not present

## 2018-04-01 DIAGNOSIS — R079 Chest pain, unspecified: Secondary | ICD-10-CM | POA: Diagnosis not present

## 2018-04-01 MED ORDER — PERFLUTREN LIPID MICROSPHERE
1.0000 mL | INTRAVENOUS | Status: AC | PRN
  Administered 2018-04-01: 5 mL via INTRAVENOUS
  Filled 2018-04-01: qty 10

## 2018-04-01 NOTE — Progress Notes (Signed)
  Echocardiogram Echocardiogram Stress Test has been performed.  Michael Solomon Michael Solomon 04/01/2018, 11:25 AM

## 2018-04-08 ENCOUNTER — Other Ambulatory Visit: Payer: Self-pay

## 2018-04-08 ENCOUNTER — Telehealth: Payer: Self-pay | Admitting: *Deleted

## 2018-04-08 ENCOUNTER — Telehealth: Payer: Self-pay

## 2018-04-08 DIAGNOSIS — R079 Chest pain, unspecified: Secondary | ICD-10-CM

## 2018-04-08 NOTE — Telephone Encounter (Signed)
Pt's wife phoned back and wants to make sure you are calling her on her cell phone [phone]. Nurse gone to lunch, please call back when you return.

## 2018-04-08 NOTE — Telephone Encounter (Signed)
Left voicemail for the patient to call the office regarding results.

## 2018-04-08 NOTE — Telephone Encounter (Signed)
Spoke with patient's wife regarding this. Please see results note for further detail.

## 2018-05-16 ENCOUNTER — Ambulatory Visit: Payer: BLUE CROSS/BLUE SHIELD | Admitting: Family Medicine

## 2018-05-16 ENCOUNTER — Encounter: Payer: Self-pay | Admitting: Family Medicine

## 2018-05-16 ENCOUNTER — Other Ambulatory Visit: Payer: Self-pay | Admitting: Family Medicine

## 2018-05-16 VITALS — BP 160/98 | HR 101 | Temp 98.2°F | Ht 67.0 in | Wt 269.0 lb

## 2018-05-16 DIAGNOSIS — L03115 Cellulitis of right lower limb: Secondary | ICD-10-CM | POA: Diagnosis not present

## 2018-05-16 DIAGNOSIS — I10 Essential (primary) hypertension: Secondary | ICD-10-CM

## 2018-05-16 DIAGNOSIS — R600 Localized edema: Secondary | ICD-10-CM | POA: Insufficient documentation

## 2018-05-16 LAB — COMPREHENSIVE METABOLIC PANEL
ALBUMIN: 4.2 g/dL (ref 3.5–5.2)
ALK PHOS: 88 U/L (ref 39–117)
ALT: 29 U/L (ref 0–53)
AST: 28 U/L (ref 0–37)
BUN: 12 mg/dL (ref 6–23)
CALCIUM: 9.2 mg/dL (ref 8.4–10.5)
CHLORIDE: 103 meq/L (ref 96–112)
CO2: 31 mEq/L (ref 19–32)
Creatinine, Ser: 0.85 mg/dL (ref 0.40–1.50)
GFR: 103.53 mL/min (ref >60.00)
Glucose, Bld: 83 mg/dL (ref 70–99)
POTASSIUM: 3.6 meq/L (ref 3.5–5.1)
Sodium: 142 mEq/L (ref 135–145)
Total Bilirubin: 0.7 mg/dL (ref 0.2–1.2)
Total Protein: 7.6 g/dL (ref 6.0–8.3)

## 2018-05-16 LAB — TSH: TSH: 1.15 u[IU]/mL (ref 0.35–4.50)

## 2018-05-16 MED ORDER — DOXYCYCLINE HYCLATE 100 MG PO TABS: 100.0000 mg | ORAL_TABLET | Freq: Two times a day (BID) | ORAL | 0 refills | Status: DC

## 2018-05-16 MED ORDER — OXYCODONE HCL 5 MG PO CAPS: 5.0000 mg | ORAL_CAPSULE | ORAL | 0 refills | Status: DC | PRN

## 2018-05-16 MED ORDER — POTASSIUM CHLORIDE CRYS ER 10 MEQ PO TBCR: 10.0000 meq | EXTENDED_RELEASE_TABLET | Freq: Two times a day (BID) | ORAL | 0 refills | Status: DC

## 2018-05-16 MED ORDER — FUROSEMIDE 40 MG PO TABS: 40.0000 mg | ORAL_TABLET | Freq: Every day | ORAL | 3 refills | Status: DC

## 2018-05-16 MED ORDER — NAPROXEN 500 MG PO TABS: 500.0000 mg | ORAL_TABLET | Freq: Two times a day (BID) | ORAL | 0 refills | Status: DC

## 2018-05-16 NOTE — Progress Notes (Signed)
Pre visit review using our clinic review tool, if applicable. No additional management support is needed unless otherwise documented below in the visit note. 

## 2018-05-16 NOTE — Progress Notes (Signed)
Chief Complaint  Patient presents with  . Abscess    Michael Solomon is a 47 y.o. male here for a skin complaint.  Duration: several days Location: R ant thigh Pruritic? No Painful? Yes Drainage? Yes New soaps/lotions/topicals/detergents? No Sick contacts? No Other associated symptoms: Some redness Therapies tried thus far: none  Since starting Humira, the patient has been having bilateral lower extremity edema.  No history of renal or hepatic failure.  No history of heart failure.  He is breathing normally.  It is difficult for him to walk.  No calf pain.  No recent travel or surgeries.  ROS:  Const: No fevers Skin: As noted in HPI  Past Medical History:  Diagnosis Date  . Anxiety   . Arthritis   . Arthritis   . Cataract   . Chronic kidney disease   . Depression   . Hypertension   . OSA (obstructive sleep apnea) 06/24/2017  . Plaque psoriasis     BP (!) 160/98 (BP Location: Left Arm, Patient Position: Sitting, Cuff Size: Large)   Pulse (!) 101   Temp 98.2 F (36.8 C) (Oral)   Ht 5' 7"  (1.702 m)   Wt 269 lb (122 kg)   SpO2 95%   BMI 42.13 kg/m  Gen: awake, alert, appearing stated age Heart: Bilateral 2+ pitting edema up to knees MSK: No calf tenderness Lungs: No accessory muscle use Skin: Hyperemia of bilateral lower extremities; over the right proximal anterior thigh, there is a 3.4 x 2.9 cm area of induration with a central opening.  There is no fluctuance or drainage.  It is tender to palpation.  No excessive erythema over this area.  There is no streaking erythema.  Excessive scaling of both lower extremities. Psych: Age appropriate judgment and insight  Bilateral lower extremity edema - Plan: furosemide (LASIX) 40 MG tablet, potassium chloride (KLOR-CON M10) 10 MEQ tablet, Comprehensive metabolic panel, TSH  Cellulitis of right lower extremity - Plan: oxycodone (OXY-IR) 5 MG capsule, doxycycline (VIBRA-TABS) 100 MG tablet  Orders as above.  Trial Lasix,  check labs.  Recommended watching salt intake, elevation of legs, and walking when able. Continue Mobic for pain, may add Tylenol.  [place] for severe breakthrough pain.  Doxycycline for infection.  If no improvement, I will see him in 5 days and we will perform incision and drainage.  We will recheck blood pressure at that time to which is likely secondary to his misery. The patient voiced understanding and agreement to the plan.  [place], DO 05/16/18 2:21 PM

## 2018-05-16 NOTE — Patient Instructions (Addendum)
Ice/cold pack over area for 10-15 min twice daily.  OK to take Tylenol 1000 mg (2 extra strength tabs) or 975 mg (3 regular strength tabs) every 6 hours as needed.  Elevate legs, stay active, mind the salt intake.   Do not drink alcohol, do any illicit/street drugs, drive or do anything that requires alertness while on this medicine.   Let us know if you need anything.

## 2018-05-19 MED ORDER — LISINOPRIL 20 MG PO TABS: 20.0000 mg | ORAL_TABLET | Freq: Every day | ORAL | 1 refills | Status: DC

## 2018-05-22 ENCOUNTER — Other Ambulatory Visit: Payer: Self-pay | Admitting: Family Medicine

## 2018-05-22 DIAGNOSIS — L03115 Cellulitis of right lower limb: Secondary | ICD-10-CM

## 2018-05-22 MED ORDER — DOXYCYCLINE HYCLATE 100 MG PO TABS: 100.0000 mg | ORAL_TABLET | Freq: Two times a day (BID) | ORAL | 0 refills | Status: AC

## 2018-05-22 NOTE — Progress Notes (Signed)
Spoke w pt, he had lost around 5 d of medication, requesting refill. Sent in.

## 2018-06-23 ENCOUNTER — Other Ambulatory Visit: Payer: Self-pay | Admitting: Family Medicine

## 2018-06-23 DIAGNOSIS — F419 Anxiety disorder, unspecified: Secondary | ICD-10-CM

## 2018-06-23 NOTE — Telephone Encounter (Signed)
Last refill on 03/28/2018  #60 with 2 refills Last office visit on 05/16/2018

## 2018-06-24 ENCOUNTER — Encounter: Payer: Self-pay | Admitting: Family Medicine

## 2018-06-24 MED ORDER — CLONAZEPAM 0.5 MG PO TABS: ORAL_TABLET | ORAL | 2 refills | Status: DC

## 2018-06-25 ENCOUNTER — Other Ambulatory Visit: Payer: Self-pay | Admitting: Family Medicine

## 2018-06-25 DIAGNOSIS — F419 Anxiety disorder, unspecified: Secondary | ICD-10-CM

## 2018-06-25 MED ORDER — CLONAZEPAM 0.5 MG PO TABS: ORAL_TABLET | ORAL | 2 refills | Status: DC

## 2018-07-09 ENCOUNTER — Encounter: Payer: Self-pay | Admitting: Family Medicine

## 2018-07-09 MED ORDER — MELOXICAM 15 MG PO TABS: ORAL_TABLET | ORAL | 1 refills | Status: DC

## 2018-07-09 MED ORDER — PAROXETINE HCL 40 MG PO TABS: 40.0000 mg | ORAL_TABLET | ORAL | 1 refills | Status: DC

## 2018-07-28 ENCOUNTER — Other Ambulatory Visit: Payer: Self-pay | Admitting: Family Medicine

## 2018-07-28 ENCOUNTER — Encounter: Payer: Self-pay | Admitting: Family Medicine

## 2018-07-28 MED ORDER — PREDNISONE 20 MG PO TABS: 40.0000 mg | ORAL_TABLET | Freq: Every day | ORAL | 0 refills | Status: AC

## 2018-08-15 ENCOUNTER — Other Ambulatory Visit: Payer: Self-pay | Admitting: Family Medicine

## 2018-08-15 ENCOUNTER — Encounter: Payer: Self-pay | Admitting: Family Medicine

## 2018-08-15 MED ORDER — PREDNISONE 20 MG PO TABS: 40.0000 mg | ORAL_TABLET | Freq: Every day | ORAL | 0 refills | Status: AC

## 2018-09-01 ENCOUNTER — Other Ambulatory Visit (INDEPENDENT_AMBULATORY_CARE_PROVIDER_SITE_OTHER): Payer: BLUE CROSS/BLUE SHIELD

## 2018-09-01 ENCOUNTER — Telehealth: Payer: Self-pay | Admitting: Family Medicine

## 2018-09-01 DIAGNOSIS — R109 Unspecified abdominal pain: Secondary | ICD-10-CM

## 2018-09-01 DIAGNOSIS — L299 Pruritus, unspecified: Secondary | ICD-10-CM

## 2018-09-01 DIAGNOSIS — L400 Psoriasis vulgaris: Secondary | ICD-10-CM

## 2018-09-01 NOTE — Telephone Encounter (Signed)
I spoke with the patient's wife about allergy testing and seeing a new dermatologist.  We will hold off on allergy referral and order testing.  New referral to dermatology placed per her request.

## 2018-09-01 NOTE — Addendum Note (Signed)
Addended by: Caffie Pinto on: 09/01/2018 12:31 PM   Modules accepted: Orders

## 2018-09-02 ENCOUNTER — Encounter: Payer: Self-pay | Admitting: Family Medicine

## 2018-09-03 MED ORDER — CLOBETASOL PROPIONATE 0.05 % EX CREA: 1.0000 "application " | TOPICAL_CREAM | Freq: Two times a day (BID) | CUTANEOUS | 0 refills | Status: DC

## 2018-09-04 LAB — FOOD ALLERGY PROFILE
ALMONDS: 0.1 kU/L — AB
Allergen, Salmon, f41: <0.1 kU/L
CASHEW IGE: 0.14 kU/L — AB
CLASS: 0
CLASS: 0
CLASS: 0
CLASS: 0
CLASS: 0
CLASS: 0
CLASS: 0
CLASS: 0
CLASS: 0
CLASS: 0
CLASS: 0
CLASS: 0
CLASS: 0
Class: 0
Class: 0
EGG WHITE IGE: 0.24 kU/L — AB
Fish Cod: 0.14 kU/L — ABNORMAL HIGH
Hazelnut: <0.1 kU/L
MILK IGE: 0.26 kU/L — AB
Peanut IgE: 0.16 kU/L — ABNORMAL HIGH
SESAME SEED IGE: 0.11 kU/L — AB
SHRIMP IGE: 0.23 kU/L — AB
Scallop IgE: 0.17 kU/L — ABNORMAL HIGH
Soybean IgE: 0.21 kU/L — ABNORMAL HIGH
Tuna IgE: 0.13 kU/L — ABNORMAL HIGH
WALNUT: 0.11 kU/L — AB
WHEAT IGE: 0.29 kU/L — AB

## 2018-09-04 LAB — TISSUE TRANSGLUTAMINASE, IGA: (tTG) Ab, IgA: 1 U/mL

## 2018-09-04 LAB — RETICULIN ANTIBODIES, IGA W TITER

## 2018-09-04 LAB — INTERPRETATION:

## 2018-09-04 LAB — ALLERGY PANEL, REGION 2, GRASSES
CLASS: 0
ORCHARD GRASS (COCKSFOOT) (G3) IGE: 0.11 kU/L — AB

## 2018-09-04 LAB — ALLERGY PANEL 11, MOLD GROUP

## 2018-09-05 ENCOUNTER — Ambulatory Visit: Payer: BLUE CROSS/BLUE SHIELD | Admitting: Family Medicine

## 2018-09-05 ENCOUNTER — Encounter: Payer: Self-pay | Admitting: Family Medicine

## 2018-09-05 VITALS — BP 120/84 | HR 103 | Temp 98.2°F | Ht 67.0 in | Wt 262.2 lb

## 2018-09-05 DIAGNOSIS — L400 Psoriasis vulgaris: Secondary | ICD-10-CM

## 2018-09-05 MED ORDER — OXYCODONE HCL 5 MG PO TABS: 5.0000 mg | ORAL_TABLET | ORAL | 0 refills | Status: DC | PRN

## 2018-09-05 MED ORDER — PREDNISONE 20 MG PO TABS: 20.0000 mg | ORAL_TABLET | Freq: Every day | ORAL | 1 refills | Status: DC

## 2018-09-05 MED ORDER — METHYLPREDNISOLONE ACETATE 80 MG/ML IJ SUSP
80.0000 mg | Freq: Once | INTRAMUSCULAR | Status: AC
  Administered 2018-09-05: 80 mg via INTRAMUSCULAR

## 2018-09-05 NOTE — Progress Notes (Signed)
Chief Complaint  Patient presents with  . Follow-up    skin    Michael Solomon is a 48 y.o. male here for a skin complaint.  Has been getting worse over past several mo after having cost issue with monoclonal ab.  Location: entire body neck down Pruritic? No Painful? Yes Drainage? No New soaps/lotions/topicals/detergents? No Sick contacts? No Other associated symptoms: scaling Known psoriasis, following with derm.  Therapies tried thus far: Humira  ROS:  Const: No fevers Skin: As noted in HPI  Past Medical History:  Diagnosis Date  . Anxiety   . Arthritis   . Arthritis   . Cataract   . Chronic kidney disease   . Depression   . Hypertension   . OSA (obstructive sleep apnea) 06/24/2017  . Plaque psoriasis     BP 120/84 (BP Location: Left Arm, Patient Position: Sitting, Cuff Size: Large)   Pulse (!) 103   Temp 98.2 F (36.8 C) (Oral)   Ht 5' 7"  (1.702 m)   Wt 262 lb 4 oz (119 kg)   SpO2 96%   BMI 41.07 kg/m  Gen: awake, alert, appearing stated age Lungs: No accessory muscle use Skin: See below. No fluctuance or drainage noted. There is scaling, warmth, erythema noted.  Psych: Age appropriate judgment and insight            Plaque psoriasis - Plan: predniSONE (DELTASONE) 20 MG tablet, oxyCODONE (OXY IR/ROXICODONE) 5 MG immediate release tablet  Pred 20 mg daily until he can get in with dermatology team. Oxy for breakthrough pain. He is very tender.  The patient voiced understanding and agreement to the plan.  [place], DO 09/05/18 10:48 AM

## 2018-09-05 NOTE — Patient Instructions (Addendum)
Call [facility] or your friend whose father works there.  Take 20 mg of prednisone starting tomorrow. Let me know over weekend via MyChart if this is not helping.  Take this until you can get in with the dermatology team.  Do not drink alcohol, do any illicit/street drugs, drive or do anything that requires alertness while on this medicine (oxycodone).  Let us know if you need anything.

## 2018-09-16 ENCOUNTER — Other Ambulatory Visit: Payer: Self-pay | Admitting: Family Medicine

## 2018-09-16 ENCOUNTER — Encounter: Payer: Self-pay | Admitting: Family Medicine

## 2018-09-16 DIAGNOSIS — F419 Anxiety disorder, unspecified: Secondary | ICD-10-CM

## 2018-09-16 MED ORDER — CLONAZEPAM 0.5 MG PO TABS: ORAL_TABLET | ORAL | 2 refills | Status: DC

## 2018-09-17 MED ORDER — CLOBETASOL PROPIONATE 0.05 % EX FOAM: Freq: Two times a day (BID) | CUTANEOUS | 1 refills | Status: AC

## 2018-09-17 MED ORDER — CLOBETASOL PROPIONATE 0.05 % EX CREA: 1.0000 "application " | TOPICAL_CREAM | Freq: Two times a day (BID) | CUTANEOUS | 0 refills | Status: DC

## 2018-10-13 DIAGNOSIS — L409 Psoriasis, unspecified: Secondary | ICD-10-CM | POA: Diagnosis not present

## 2018-10-27 ENCOUNTER — Encounter: Payer: Self-pay | Admitting: Family Medicine

## 2018-10-27 DIAGNOSIS — L409 Psoriasis, unspecified: Secondary | ICD-10-CM | POA: Diagnosis not present

## 2018-11-04 DIAGNOSIS — Z79899 Other long term (current) drug therapy: Secondary | ICD-10-CM | POA: Diagnosis not present

## 2018-11-04 DIAGNOSIS — Z5181 Encounter for therapeutic drug level monitoring: Secondary | ICD-10-CM | POA: Diagnosis not present

## 2018-12-09 ENCOUNTER — Telehealth: Payer: Self-pay | Admitting: Family Medicine

## 2018-12-09 DIAGNOSIS — F419 Anxiety disorder, unspecified: Secondary | ICD-10-CM

## 2018-12-09 MED ORDER — CLONAZEPAM 0.5 MG PO TABS: ORAL_TABLET | ORAL | 2 refills | Status: DC

## 2018-12-09 NOTE — Telephone Encounter (Signed)
Refill request for Clonazepam Last OV---09/05/2018 Last Refill---#60 with 2 refills on 09/16/2018

## 2019-02-02 ENCOUNTER — Other Ambulatory Visit: Payer: Self-pay | Admitting: Family Medicine

## 2019-02-02 MED ORDER — CLOBETASOL PROPIONATE 0.05 % EX CREA: 1.0000 "application " | TOPICAL_CREAM | Freq: Two times a day (BID) | CUTANEOUS | 0 refills | Status: DC

## 2019-02-19 ENCOUNTER — Other Ambulatory Visit: Payer: Self-pay | Admitting: Family Medicine

## 2019-02-19 MED ORDER — CLOBETASOL PROPIONATE 0.05 % EX CREA: 1.0000 "application " | TOPICAL_CREAM | Freq: Two times a day (BID) | CUTANEOUS | 1 refills | Status: DC

## 2019-02-19 NOTE — Telephone Encounter (Signed)
Want to know if prescription can be changed the foam instead of the cream. Please advise

## 2019-02-19 NOTE — Telephone Encounter (Signed)
Please advise 

## 2019-02-19 NOTE — Telephone Encounter (Signed)
Medication Refill - Medication: clobetasol   topic foam   Has the patient contacted their pharmacy? Yes.   (Agent: If no, request that the patient contact the pharmacy for the refill.) (Agent: If yes, when and what did the pharmacy advise?)  Preferred Pharmacy (with phone number or street name): [phone] [facility]    Pt does not want the cream,  He wants the foam They have faxed this multiple times  Agent: Please be advised that RX refills may take up to 3 business days. We ask that you follow-up with your pharmacy.

## 2019-02-19 NOTE — Telephone Encounter (Signed)
Rx sent 

## 2019-02-20 MED ORDER — CLOBETASOL PROPIONATE 0.05 % EX FOAM: Freq: Two times a day (BID) | CUTANEOUS | 1 refills | Status: DC

## 2019-02-20 NOTE — Telephone Encounter (Signed)
OK 

## 2019-02-20 NOTE — Addendum Note (Signed)
Addended by: Sharon Seller B on: 02/20/2019 09:35 AM   Modules accepted: Orders

## 2019-02-20 NOTE — Telephone Encounter (Signed)
Changed / sent in

## 2019-02-24 ENCOUNTER — Other Ambulatory Visit: Payer: Self-pay | Admitting: Family Medicine

## 2019-02-24 MED ORDER — MELOXICAM 15 MG PO TABS: ORAL_TABLET | ORAL | 1 refills | Status: DC

## 2019-03-03 ENCOUNTER — Telehealth: Payer: Self-pay | Admitting: *Deleted

## 2019-03-03 DIAGNOSIS — F419 Anxiety disorder, unspecified: Secondary | ICD-10-CM

## 2019-03-03 NOTE — Telephone Encounter (Signed)
Michael Solomon requesting refill for clonazepam.  Last written: 12/09/18 Last ov:09/05/18  Next ov: none Contract:  none UDS: none

## 2019-03-04 MED ORDER — CLONAZEPAM 0.5 MG PO TABS: ORAL_TABLET | ORAL | 2 refills | Status: DC

## 2019-03-04 NOTE — Telephone Encounter (Signed)
Scheduled appt.

## 2019-03-04 NOTE — Telephone Encounter (Signed)
Schedule him for CPE. Will need UDS and update CSC. Ty.

## 2019-03-06 ENCOUNTER — Encounter: Payer: Self-pay | Admitting: Family Medicine

## 2019-03-06 ENCOUNTER — Ambulatory Visit (INDEPENDENT_AMBULATORY_CARE_PROVIDER_SITE_OTHER): Payer: BC Managed Care – PPO | Admitting: Family Medicine

## 2019-03-06 ENCOUNTER — Other Ambulatory Visit: Payer: Self-pay

## 2019-03-06 VITALS — BP 130/84 | HR 82 | Temp 98.7°F | Ht 67.0 in | Wt 242.2 lb

## 2019-03-06 DIAGNOSIS — Z125 Encounter for screening for malignant neoplasm of prostate: Secondary | ICD-10-CM

## 2019-03-06 DIAGNOSIS — S46911A Strain of unspecified muscle, fascia and tendon at shoulder and upper arm level, right arm, initial encounter: Secondary | ICD-10-CM

## 2019-03-06 DIAGNOSIS — Z79899 Other long term (current) drug therapy: Secondary | ICD-10-CM

## 2019-03-06 DIAGNOSIS — Z9229 Personal history of other drug therapy: Secondary | ICD-10-CM

## 2019-03-06 DIAGNOSIS — Z0000 Encounter for general adult medical examination without abnormal findings: Secondary | ICD-10-CM | POA: Diagnosis not present

## 2019-03-06 MED ORDER — PREDNISONE 20 MG PO TABS: 40.0000 mg | ORAL_TABLET | Freq: Every day | ORAL | 0 refills | Status: AC

## 2019-03-06 NOTE — Patient Instructions (Addendum)
Give [place] 2-3 business days to get the results of your labs back.   Keep the diet clean and stay active.  Let us know if you need anything.  Heat (pad or rice pillow in microwave) over affected area, 10-15 minutes twice daily.   Trapezius stretches/exercises Do exercises exactly as told by your health care provider and adjust them as directed. It is normal to feel mild stretching, pulling, tightness, or discomfort as you do these exercises, but you should stop right away if you feel sudden pain or your pain gets worse.  Stretching and range of motion exercises These exercises warm up your muscles and joints and improve the movement and flexibility of your shoulder. These exercises can also help to relieve pain, numbness, and tingling. If you are unable to do any of the following for any reason, do not further attempt to do it.   Exercise A: Flexion, standing    1. Stand and hold a broomstick, a cane, or a similar object. Place your hands a little more than shoulder-width apart on the object. Your left / right hand should be palm-up, and your other hand should be palm-down. 2. Push the stick to raise your left / right arm out to your side and then over your head. Use your other hand to help move the stick. Stop when you feel a stretch in your shoulder, or when you reach the angle that is recommended by your health care provider. ? Avoid shrugging your shoulder while you raise your arm. Keep your shoulder blade tucked down toward your spine. 3. Hold for 30 seconds. 4. Slowly return to the starting position. Repeat 2 times. Complete this exercise 3 times per week.  Exercise B: Abduction, supine    1. Lie on your back and hold a broomstick, a cane, or a similar object. Place your hands a little more than shoulder-width apart on the object. Your left / right hand should be palm-up, and your other hand should be palm-down. 2. Push the stick to raise your left / right arm out to your side and  then over your head. Use your other hand to help move the stick. Stop when you feel a stretch in your shoulder, or when you reach the angle that is recommended by your health care provider. ? Avoid shrugging your shoulder while you raise your arm. Keep your shoulder blade tucked down toward your spine. 3. Hold for 30 seconds. 4. Slowly return to the starting position. Repeat 2 times. Complete this exercise 3 times per week.  Exercise C: Flexion, active-assisted    1. Lie on your back. You may bend your knees for comfort. 2. Hold a broomstick, a cane, or a similar object. Place your hands about shoulder-width apart on the object. Your palms should face toward your feet. 3. Raise the stick and move your arms over your head and behind your head, toward the floor. Use your healthy arm to help your left / right arm move farther. Stop when you feel a gentle stretch in your shoulder, or when you reach the angle where your health care provider tells you to stop. 4. Hold for 30 seconds. 5. Slowly return to the starting position. Repeat 2 times. Complete this exercise 3 times per week.  Exercise D: External rotation and abduction    1. Stand in a door frame with one of your feet slightly in front of the other. This is called a staggered stance. 2. Choose one of the following positions  as told by your health care provider: ? Place your hands and forearms on the door frame above your head. ? Place your hands and forearms on the door frame at the height of your head. ? Place your hands on the door frame at the height of your elbows. 3. Slowly move your weight onto your front foot until you feel a stretch across your chest and in the front of your shoulders. Keep your head and chest upright and keep your abdominal muscles tight. 4. Hold for 30 seconds. 5. To release the stretch, shift your weight to your back foot. Repeat 2 times. Complete this stretch 3 times per week.  Strengthening  exercises These exercises build strength and endurance in your shoulder. Endurance is the ability to use your muscles for a long time, even after your muscles get tired. Exercise E: Scapular depression and adduction  1. Sit on a stable chair. Support your arms in front of you with pillows, armrests, or a tabletop. Keep your elbows in line with the sides of your body. 2. Gently move your shoulder blades down toward your middle back. Relax the muscles on the tops of your shoulders and in the back of your neck. 3. Hold for 3 seconds. 4. Slowly release the tension and relax your muscles completely before doing this exercise again. Repeat for a total of 10 repetitions. 5. After you have practiced this exercise, try doing the exercise without the arm support. Then, try the exercise while standing instead of sitting. Repeat 2 times. Complete this exercise 3 times per week.  Exercise F: Shoulder abduction, isometric    1. Stand or sit about 4-6 inches (10-15 cm) from a wall with your left / right side facing the wall. 2. Bend your left / right elbow and gently press your elbow against the wall. 3. Increase the pressure slowly until you are pressing as hard as you can without shrugging your shoulder. 4. Hold for 3 seconds. 5. Slowly release the tension and relax your muscles completely. Repeat for a total of 10 repetitions. Repeat 2 times. Complete this exercise 3 times per week.  Exercise G: Shoulder flexion, isometric    1. Stand or sit about 4-6 inches (10-15 cm) away from a wall with your left / right side facing the wall. 2. Keep your left / right elbow straight and gently press the top of your fist against the wall. Increase the pressure slowly until you are pressing as hard as you can without shrugging your shoulder. 3. Hold for 10-15 seconds. 4. Slowly release the tension and relax your muscles completely. Repeat for a total of 10 repetitions. Repeat 2 times. Complete this exercise 3  times per week.  Exercise H: Internal rotation    1. Sit in a stable chair without armrests, or stand. Secure an exercise band at your left / right side, at elbow height. 2. Place a soft object, such as a folded towel or a small pillow, under your left / right upper arm so your elbow is a few inches (about 8 cm) away from your side. 3. Hold the end of the exercise band so the band stretches. 4. Keeping your elbow pressed against the soft object under your arm, move your forearm across your body toward your abdomen. Keep your body steady so the movement is only coming from your shoulder. 5. Hold for 3 seconds. 6. Slowly return to the starting position. Repeat for a total of 10 repetitions. Repeat 2 times. Complete this  exercise 3 times per week.  Exercise I: External rotation    1. Sit in a stable chair without armrests, or stand. 2. Secure an exercise band at your left / right side, at elbow height. 3. Place a soft object, such as a folded towel or a small pillow, under your left / right upper arm so your elbow is a few inches (about 8 cm) away from your side. 4. Hold the end of the exercise band so the band stretches. 5. Keeping your elbow pressed against the soft object under your arm, move your forearm out, away from your abdomen. Keep your body steady so the movement is only coming from your shoulder. 6. Hold for 3 seconds. 7. Slowly return to the starting position. Repeat for a total of 10 repetitions. Repeat 2 times. Complete this exercise 3 times per week. Exercise J: Shoulder extension  1. Sit in a stable chair without armrests, or stand. Secure an exercise band to a stable object in front of you so the band is at shoulder height. 2. Hold one end of the exercise band in each hand. Your palms should face each other. 3. Straighten your elbows and lift your hands up to shoulder height. 4. Step back, away from the secured end of the exercise band, until the band  stretches. 5. Squeeze your shoulder blades together and pull your hands down to the sides of your thighs. Stop when your hands are straight down by your sides. Do not let your hands go behind your body. 6. Hold for 3 seconds. 7. Slowly return to the starting position. Repeat for a total of 10 repetitions. Repeat 2 times. Complete this exercise 3 times per week.  Exercise K: Shoulder extension, prone    1. Lie on your abdomen on a firm surface so your left / right arm hangs over the edge. 2. Hold a 5 lb weight in your hand so your palm faces in toward your body. Your arm should be straight. 3. Squeeze your shoulder blade down toward the middle of your back. 4. Slowly raise your arm behind you, up to the height of the surface that you are lying on. Keep your arm straight. 5. Hold for 3 seconds. 6. Slowly return to the starting position and relax your muscles. Repeat for a total of 10 repetitions. Repeat 2 times. Complete this exercise 3 times per week.   Exercise L: Horizontal abduction, prone  1. Lie on your abdomen on a firm surface so your left / right arm hangs over the edge. 2. Hold a 5 lb weight in your hand so your palm faces toward your feet. Your arm should be straight. 3. Squeeze your shoulder blade down toward the middle of your back. 4. Bend your elbow so your hand moves up, until your elbow is bent to an "L" shape (90 degrees). With your elbow bent, slowly move your forearm forward and up. Raise your hand up to the height of the surface that you are lying on. ? Your upper arm should not move, and your elbow should stay bent. ? At the top of the movement, your palm should face the floor. 5. Hold for 3 seconds. 6. Slowly return to the starting position and relax your muscles. Repeat for a total of 10 repetitions. Repeat 2 times. Complete this exercise 3 times per week.  Exercise M: Horizontal abduction, standing  1. Sit on a stable chair, or stand. 2. Secure an exercise band  to a stable object in  front of you so the band is at shoulder height. 3. Hold one end of the exercise band in each hand. 4. Straighten your elbows and lift your hands straight in front of you, up to shoulder height. Your palms should face down, toward the floor. 5. Step back, away from the secured end of the exercise band, until the band stretches. 6. Move your arms out to your sides, and keep your arms straight. 7. Hold for 3 seconds. 8. Slowly return to the starting position. Repeat for a total of 10 repetitions. Repeat 2 times. Complete this exercise 3 times per week.  Exercise N: Scapular retraction and elevation  1. Sit on a stable chair, or stand. 2. Secure an exercise band to a stable object in front of you so the band is at shoulder height. 3. Hold one end of the exercise band in each hand. Your palms should face each other. 4. Sit in a stable chair without armrests, or stand. 5. Step back, away from the secured end of the exercise band, until the band stretches. 6. Squeeze your shoulder blades together and lift your hands over your head. Keep your elbows straight. 7. Hold for 3 seconds. 8. Slowly return to the starting position. Repeat for a total of 10 repetitions. Repeat 2 times. Complete this exercise 3 times per week.  This information is not intended to replace advice given to you by your health care provider. Make sure you discuss any questions you have with your health care provider. Document Released: 07/23/2005 Document Revised: 03/29/2016 Document Reviewed: 06/09/2015 Elsevier Interactive Patient Education  2017 [employer].

## 2019-03-06 NOTE — Progress Notes (Signed)
Chief Complaint  Patient presents with  . Annual Exam    Well Male Michael Solomon is here for a complete physical.   His last physical was >1 year ago.  Current diet: in general, diet improving.   Current exercise: active at work Weight trend: has lost around 20 lbs after  Does pt snore? No. Daytime fatigue? No. Seat belt? Yes.    2 weeks ago was lifting his brother who had fallen. The person who was helping lift him lost grip and he was left with all of the wt. His bro weighs 305 lbs. He felt R sided pain through his RUE and shoulder area. Having weakness/numbness in RUE.   Health maintenance Tetanus- Yes HIV- Yes  Past Medical History:  Diagnosis Date  . Anxiety   . Arthritis   . Arthritis   . Cataract   . Chronic kidney disease   . Depression   . Hypertension   . OSA (obstructive sleep apnea) 06/24/2017  . Plaque psoriasis      Past Surgical History:  Procedure Laterality Date  . EYE SURGERY Bilateral     Medications  Current Outpatient Medications on File Prior to Visit  Medication Sig Dispense Refill  . ammonium lactate (LAC-HYDRIN) 12 % lotion APPLY TO THE AFFECTED AREAS OF SKIN BID PRN  2  . clobetasol (OLUX) 0.05 % topical foam Apply topically 2 (two) times daily. 50 g 1  . clobetasol cream (TEMOVATE) 2.99 % Apply 1 application topically 2 (two) times daily. 60 g 1  . clonazePAM (KLONOPIN) 0.5 MG tablet TAKE 1 TABLET(0.5 MG) BY MOUTH TWICE DAILY AS NEEDED FOR ANXIETY 60 tablet 2  . meloxicam (MOBIC) 15 MG tablet TAKE 1 TABLET(15 MG) BY MOUTH DAILY 90 tablet 1  . PARoxetine (PAXIL) 40 MG tablet Take 1 tablet (40 mg total) by mouth every morning. 90 tablet 1  . potassium chloride (KLOR-CON M10) 10 MEQ tablet Take 1 tablet (10 mEq total) by mouth 2 (two) times daily. 30 tablet 0  . tadalafil (CIALIS) 5 MG tablet TAKE 1 TABLET(5 MG) BY MOUTH DAILY AS NEEDED FOR ERECTILE DYSFUNCTION 90 tablet 1  . Talazoparib Tosylate (TALZENNA PO) Take by mouth.    Marland Kitchen TALTZ 80  MG/ML SOAJ      Allergies No Known Allergies  Family History Family History  Problem Relation Age of Onset  . Cancer Mother   . Dementia Mother   . Hypertension Mother   . Thyroid disease Mother   . Diabetes Father   . Heart disease Father   . Stroke Father   . Hypertension Father   . Diabetes Brother   . Depression Brother     Review of Systems: Constitutional: no fevers or chills Eye:  no recent significant change in vision Ear/Nose/Mouth/Throat:  Ears:  no tinnitus or hearing loss Nose/Mouth/Throat:  no complaints of nasal congestion, no sore throat Cardiovascular:  no chest pain, no palpitations Respiratory:  no cough and no shortness of breath Gastrointestinal:  no abdominal pain, no change in bowel habits GU:  Male: negative for dysuria, frequency, and incontinence and negative for prostate symptoms Musculoskeletal/Extremities: +RUE pain; otherwise no pain, redness, or swelling of the joints Integumentary (Skin/Breast):  no abnormal skin lesions reported Neurologic:  +weakness in RUE Endocrine: No unexpected weight changes Hematologic/Lymphatic:  no night sweats  Exam BP 130/84 (BP Location: Left Arm, Patient Position: Sitting, Cuff Size: Large)   Pulse 82   Temp 98.7 F (37.1 C) (Oral)   Ht 5'  7" (1.702 m)   Wt 242 lb 4 oz (109.9 kg)   SpO2 97%   BMI 37.94 kg/m  General:  well developed, well nourished, in no apparent distress Skin:  no significant moles, warts, or growths Head:  no masses, lesions, or tenderness Eyes:  pupils equal and round, sclera anicteric without injection Ears:  canals without lesions, TMs shiny without retraction, no obvious effusion, no erythema Nose:  nares patent, septum midline, mucosa normal Throat/Pharynx:  lips and gingiva without lesion; tongue and uvula midline; non-inflamed pharynx; no exudates or postnasal drainage Neck: neck supple without adenopathy, thyromegaly, or masses Lungs:  clear to auscultation, breath sounds  equal bilaterally, no respiratory distress Cardio:  regular rate and rhythm, no bruits, no LE edema Abdomen:  abdomen soft, nontender; bowel sounds normal; no masses or organomegaly Rectal: Deferred Musculoskeletal: 4/5 elbow flexion and grip strength on R; 5/5 strength throughout otherwise symmetrical muscle groups noted without atrophy or deformity Extremities:  no clubbing, cyanosis, or edema, no deformities, no skin discoloration Neuro:  gait normal; deep tendon reflexes normal and symmetric Psych: well oriented with normal range of affect and appropriate judgment/insight  Assessment and Plan  Well adult exam - Plan: CBC, Comprehensive metabolic panel, Lipid panel, Hemoglobin A1c  Screening for malignant neoplasm of prostate - Plan: PSA, counseled on risks and benefits of screening for prostate cancer w PSA. He agrees to undergo testing.   History of high risk medication treatment - Plan: Pain Mgmt, Profile 8 w/Conf, U  Muscle strain of right upper arm, initial encounter - Plan: predniSONE (DELTASONE) 20 MG tablet, stretches/exercises for trap, heat, ice, Tylenol.    Well 48 y.o. male. Counseled on diet and exercise. Other orders as above. Follow up in 6 mo pending the above workup. The patient voiced understanding and agreement to the plan.  Jeddito, DO 03/06/19 5:07 PM

## 2019-03-07 LAB — PAIN MGMT, PROFILE 8 W/CONF, U
6 Acetylmorphine: NEGATIVE ng/mL
Alcohol Metabolites: NEGATIVE ng/mL (ref <500)
Amphetamines: NEGATIVE ng/mL
Benzodiazepines: NEGATIVE ng/mL
Buprenorphine, Urine: NEGATIVE ng/mL
Cocaine Metabolite: NEGATIVE ng/mL
Creatinine: 98.8 mg/dL
MDMA: NEGATIVE ng/mL
Marijuana Metabolite: NEGATIVE ng/mL
Opiates: NEGATIVE ng/mL
Oxidant: NEGATIVE ug/mL
Oxycodone: NEGATIVE ng/mL
pH: 6.3 (ref 4.5–9.0)

## 2019-03-07 LAB — HEMOGLOBIN A1C
Hgb A1c MFr Bld: 5.5 % of total Hgb (ref <5.7)
Mean Plasma Glucose: 111 (calc)
eAG (mmol/L): 6.2 (calc)

## 2019-03-09 ENCOUNTER — Telehealth: Payer: Self-pay | Admitting: Family Medicine

## 2019-03-09 NOTE — Telephone Encounter (Signed)
Spoke to [employer] about sched 6 month f/u. She stated they will call later to schedule.

## 2019-05-29 ENCOUNTER — Encounter: Payer: Self-pay | Admitting: Family Medicine

## 2019-05-29 ENCOUNTER — Other Ambulatory Visit: Payer: Self-pay | Admitting: Family Medicine

## 2019-05-29 DIAGNOSIS — F419 Anxiety disorder, unspecified: Secondary | ICD-10-CM

## 2019-05-29 MED ORDER — PAROXETINE HCL 40 MG PO TABS: 40.0000 mg | ORAL_TABLET | ORAL | 1 refills | Status: DC

## 2019-05-29 MED ORDER — CLONAZEPAM 0.5 MG PO TABS: ORAL_TABLET | ORAL | 2 refills | Status: DC

## 2019-05-29 NOTE — Telephone Encounter (Signed)
Requesting:   clonazepam Contract:   03/06/2019 UDS:    03/06/2019 Last Visit:   03/06/2019 Next Visit:  none Last Refill:   03/04/2019----#60 with 2 refills  Please Advise

## 2019-06-29 ENCOUNTER — Other Ambulatory Visit: Payer: Self-pay | Admitting: Family Medicine

## 2019-06-29 MED ORDER — PREDNISONE 20 MG PO TABS: 40.0000 mg | ORAL_TABLET | Freq: Every day | ORAL | 0 refills | Status: AC

## 2019-07-17 ENCOUNTER — Ambulatory Visit: Payer: BC Managed Care – PPO | Admitting: Cardiology

## 2019-08-27 IMAGING — DX DG FINGER THUMB 2+V*L*
3 series · 3 of 3 positions shown · non-contrast
Comparison: None.

CLINICAL DATA: Pain after hitting thumb with hammer

EXAM:
LEFT THUMB 2+V

[finger ap]
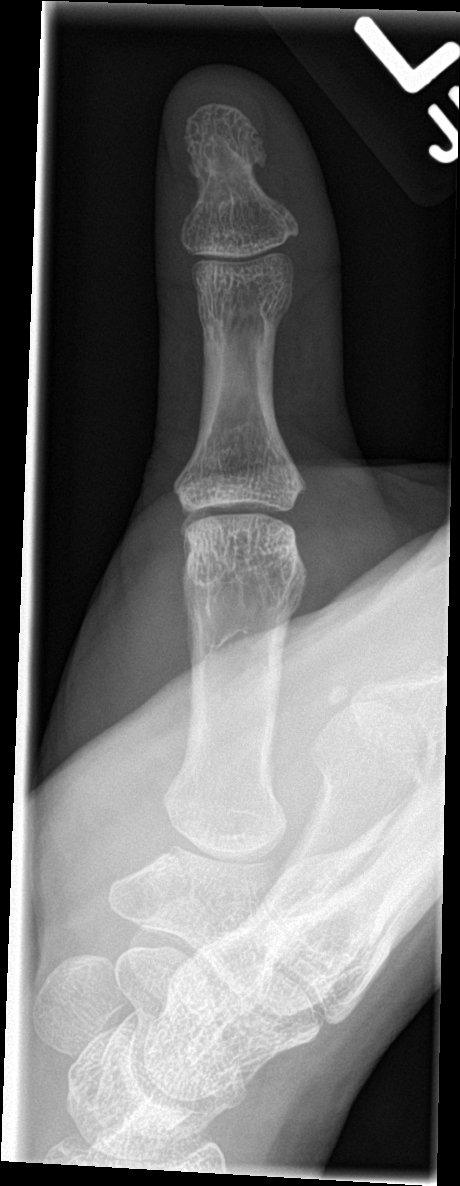

[finger obl]
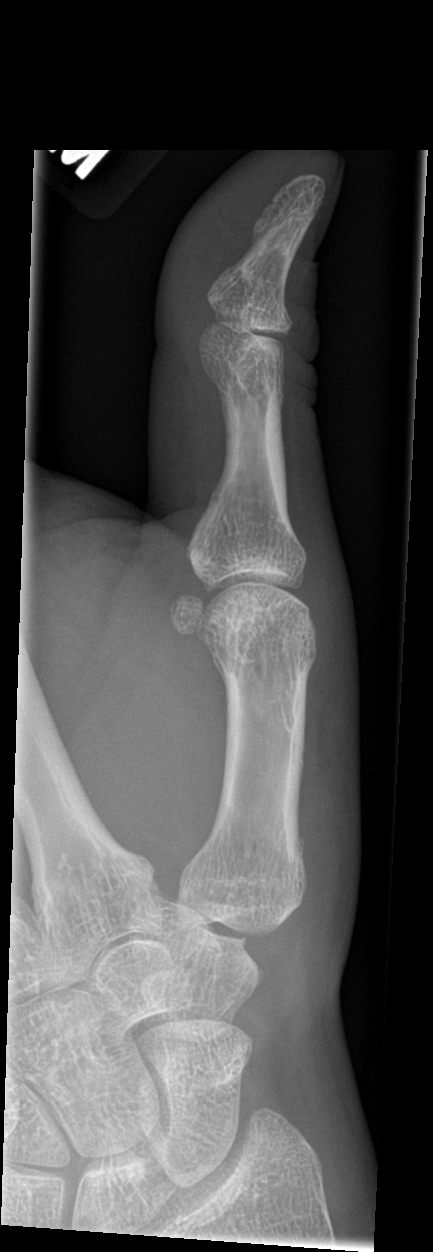

[finger lat]
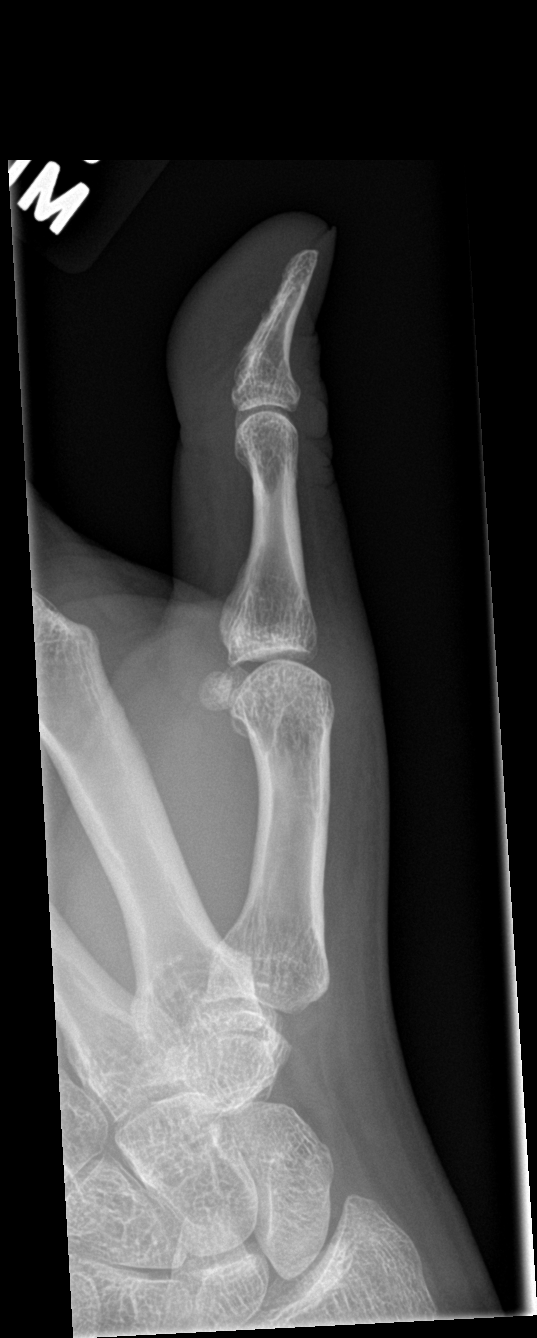

[3 of 3 positions shown; findings below may reference images not displayed]

FINDINGS: Frontal, oblique, and lateral views were obtained. There is no
appreciable fracture or dislocation. Joint spaces appear normal. No
erosive change.
IMPRESSION: No fracture or dislocation.  No appreciable arthropathy.

## 2019-08-28 ENCOUNTER — Telehealth: Payer: Self-pay | Admitting: Family Medicine

## 2019-08-28 DIAGNOSIS — F419 Anxiety disorder, unspecified: Secondary | ICD-10-CM

## 2019-08-28 MED ORDER — CLONAZEPAM 0.5 MG PO TABS: ORAL_TABLET | ORAL | 2 refills | Status: DC

## 2019-08-28 NOTE — Telephone Encounter (Signed)
Clonazepam needs to be resent there was an error in the process of send ing to pharmacy

## 2019-08-28 NOTE — Addendum Note (Signed)
Addended by: Ames Coupe on: 08/28/2019 04:43 PM   Modules accepted: Orders

## 2019-08-28 NOTE — Telephone Encounter (Signed)
Resent

## 2019-08-28 NOTE — Telephone Encounter (Signed)
Requesting:   Clonazepam Contract:    03/06/2019 UDS:    03/06/2019 Last Visit:   03/06/2019 Next Visit:    none Last Refill:    #60 with 2 refills 05/29/2019  Please Advise

## 2019-09-07 ENCOUNTER — Other Ambulatory Visit: Payer: Self-pay

## 2019-09-08 ENCOUNTER — Encounter: Payer: Self-pay | Admitting: Family Medicine

## 2019-09-08 ENCOUNTER — Ambulatory Visit: Payer: BC Managed Care – PPO | Admitting: Cardiology

## 2019-09-08 ENCOUNTER — Ambulatory Visit (HOSPITAL_BASED_OUTPATIENT_CLINIC_OR_DEPARTMENT_OTHER)
Admission: RE | Admit: 2019-09-08 | Discharge: 2019-09-08 | Disposition: A | Discharge status: Home / Self Care | Payer: BC Managed Care – PPO | Source: Ambulatory Visit | Attending: Family Medicine | Admitting: Family Medicine

## 2019-09-08 ENCOUNTER — Ambulatory Visit: Payer: BC Managed Care – PPO | Admitting: Family Medicine

## 2019-09-08 VITALS — BP 138/86 | HR 88 | Temp 97.7°F | Wt 249.0 lb

## 2019-09-08 DIAGNOSIS — M79641 Pain in right hand: Secondary | ICD-10-CM

## 2019-09-08 DIAGNOSIS — L400 Psoriasis vulgaris: Secondary | ICD-10-CM

## 2019-09-08 DIAGNOSIS — M79642 Pain in left hand: Secondary | ICD-10-CM | POA: Insufficient documentation

## 2019-09-08 DIAGNOSIS — M25532 Pain in left wrist: Secondary | ICD-10-CM

## 2019-09-08 DIAGNOSIS — M25531 Pain in right wrist: Secondary | ICD-10-CM

## 2019-09-08 DIAGNOSIS — R200 Anesthesia of skin: Secondary | ICD-10-CM | POA: Diagnosis not present

## 2019-09-08 DIAGNOSIS — M25641 Stiffness of right hand, not elsewhere classified: Secondary | ICD-10-CM | POA: Diagnosis not present

## 2019-09-08 DIAGNOSIS — M25642 Stiffness of left hand, not elsewhere classified: Secondary | ICD-10-CM | POA: Diagnosis not present

## 2019-09-08 NOTE — Progress Notes (Signed)
Musculoskeletal Exam  Patient: Michael Solomon DOB: 04-21-1971  DOS: 09/08/2019  SUBJECTIVE:  Chief Complaint:   Chief Complaint  Patient presents with  . Hand Pain    both and numbness  . Wrist Pain    Michael Solomon is a 49 y.o.  male for evaluation and treatment of b/l hand pain.   Onset:  1 month ago. No inj or change in activity.  Location:  Character:  aching  Progression of issue:  has worsened Associated symptoms: numbness Treatment: to date has been none.   Neurovascular symptoms: no  ROS: Musculoskeletal/Extremities: +hand/wrist pain  Past Medical History:  Diagnosis Date  . Anxiety   . Arthritis   . Arthritis   . Cataract   . Chronic kidney disease   . Depression   . Hypertension   . OSA (obstructive sleep apnea) 06/24/2017  . Plaque psoriasis     Objective: VITAL SIGNS: BP 138/86 (BP Location: Left Arm, Patient Position: Sitting, Cuff Size: Normal)   Pulse 88   Temp 97.7 F (36.5 C) (Temporal)   Wt 249 lb (112.9 kg)   SpO2 96%   BMI 39.00 kg/m  Constitutional: Well formed, well developed. No acute distress. Cardiovascular: Brisk cap refill Thorax & Lungs: No accessory muscle use Musculoskeletal: hands.   Normal active range of motion: yes.   Normal passive range of motion: yes Tenderness to palpation: no Deformity: no Ecchymosis: no Tests positive: none Tests negative: Finkelstein's b/l, Tinel's b/l Neurologic: Normal sensory function. No focal deficits noted.  Psychiatric: Normal mood. Age appropriate judgment and insight. Alert & oriented x 3.    Assessment:  Pain in both hands - Plan: DG Hand Complete Left, DG Hand Complete Right  Plaque psoriasis  Pain in both wrists  Plan: Suspect OA vs psoriatric arthritis. Will ck XR to see if there are jt erosions. If OA, will refer to hand. If inflammatory, will refer to rheum.  Wrist splints and stretches/exercises given. Suspect a component of CT.  F/u as originally scheduled. The patient  voiced understanding and agreement to the plan.   [place], DO 09/08/19  7:43 PM

## 2019-09-08 NOTE — Patient Instructions (Addendum)
Wear the wrist splints at night.  Ice/cold pack over area for 10-15 min twice daily.  OK to take Tylenol 1000 mg (2 extra strength tabs) or 975 mg (3 regular strength tabs) every 6 hours as needed.  Voltaren gel over the counter can be helpful.  Our next steps are dictated by the results of your X-ray.   Let us know if you need anything.  Wrist and Forearm Exercises Do exercises exactly as told by your health care provider and adjust them as directed. It is normal to feel mild stretching, pulling, tightness, or discomfort as you do these exercises, but you should stop right away if you feel sudden pain or your pain gets worse.   RANGE OF MOTION EXERCISES These exercises warm up your muscles and joints and improve the movement and flexibility of your injured wrist and forearm. These exercises also help to relieve pain, numbness, and tingling. These exercises are done using the muscles in your injured wrist and forearm. Exercise A: Wrist Flexion, Active 1. With your fingers relaxed, bend your wrist forward as far as you can. 2. Hold this position for 30 seconds. Repeat 2 times. Complete this exercise 3 times per week. Exercise B: Wrist Extension, Active 1. With your fingers relaxed, bend your wrist backward as far as you can. 2. Hold this position for 30 seconds. Repeat 2 times. Complete this exercise 3 times per week. Exercise C: Supination, Active  1. Stand or sit with your arms at your sides. 2. Bend your left / right elbow to an "L" shape (90 degrees). 3. Turn your palm upward until you feel a gentle stretch on the inside of your forearm. 4. Hold this position for 30 seconds. 5. Slowly return your palm to the starting position. Repeat 2 times. Complete this exercise 3 times per week. Exercise D: Pronation, Active  1. Stand or sit with your arms at your sides. 2. Bend your left / right elbow to an "L" shape (90 degrees). 3. Turn your palm downward until you feel a gentle stretch  on the top of your forearm. 4. Hold this position for 30 seconds. 5. Slowly return your palm to the starting position. Repeat 2 times. Complete this exercise once a day.  STRETCHING EXERCISES These exercises warm up your muscles and joints and improve the movement and flexibility of your injured wrist and forearm. These exercises also help to relieve pain, numbness, and tingling. These exercises are done using your healthy wrist and forearm to help stretch the muscles in your injured wrist and forearm. Exercise E: Wrist Flexion, Passive  1. Extend your left / right arm in front of you, relax your wrist, and point your fingers downward. 2. Gently push on the back of your hand. Stop when you feel a gentle stretch on the top of your forearm. 3. Hold this position for 30 seconds. Repeat 2 times. Complete this exercise 3 times per week. Exercise F: Wrist Extension, Passive  1. Extend your left / right arm in front of you and turn your palm upward. 2. Gently pull your palm and fingertips back so your fingers point downward. You should feel a gentle stretch on the palm-side of your forearm. 3. Hold this position for 30 seconds. Repeat 2 times. Complete this exercise 3 times per week. Exercise G: Forearm Rotation, Supination, Passive 1. Sit with your left / right elbow bent to an "L" shape (90 degrees) with your forearm resting on a table. 2. Keeping your upper body and  shoulder still, use your other hand to rotate your forearm palm-up until you feel a gentle to moderate stretch. 3. Hold this position for 30 seconds. 4. Slowly release the stretch and return to the starting position. Repeat 2 times. Complete this exercise 3 times per week. Exercise H: Forearm Rotation, Pronation, Passive 1. Sit with your left / right elbow bent to an "L" shape (90 degrees) with your forearm resting on a table. 2. Keeping your upper body and shoulder still, use your other hand to rotate your forearm palm-down until  you feel a gentle to moderate stretch. 3. Hold this position for 30 seconds. 4. Slowly release the stretch and return to the starting position. Repeat 2 times. Complete this exercise 3 times per week.  STRENGTHENING EXERCISES These exercises build strength and endurance in your wrist and forearm. Endurance is the ability to use your muscles for a long time, even after they get tired. Exercise I: Wrist Flexors  1. Sit with your left / right forearm supported on a table and your hand resting palm-up over the edge of the table. Your elbow should be bent to an "L" shape (about 90 degrees) and be below the level of your shoulder. 2. Hold a 3-5 lb weight in your left / right hand. Or, hold a rubber exercise band or tube in both hands, keeping your hands at the same level and hip distance apart. There should be a slight tension in the exercise band or tube. 3. Slowly curl your hand up toward your forearm. 4. Hold this position for 3 seconds. 5. Slowly lower your hand back to the starting position. Repeat 2 times. Complete this exercise 3 times per week. Exercise J: Wrist Extensors  1. Sit with your left / right forearm supported on a table and your hand resting palm-down over the edge of the table. Your elbow should be bent to an "L" shape (about 90 degrees) and be below the level of your shoulder. 2. Hold a 3-5 lb weight in your left / right hand. Or, hold a rubber exercise band or tube in both hands, keeping your hands at the same level and hip distance apart. There should be a slight tension in the exercise band or tube. 3. Slowly curl your hand up toward your forearm. 4. Hold this position for 3 seconds. 5. Slowly lower your hand back to the starting position. Repeat 2 times. Complete this exercise 3 times per week. Exercise K: Forearm Rotation, Supination  1. Sit with your left / right forearm supported on a table and your hand resting palm-down. Your elbow should be at your side, bent to an  "L" shape (about 90 degrees), and below the level of your shoulder. Keep your wrist stable and in a neutral position throughout the exercise. 2. Gently hold a lightweight hammer with your left / right hand. 3. Without moving your elbow or wrist, slowly rotate your palm upward to a thumbs-up position. 4. Hold this position for 3 seconds. 5. Slowly return your forearm to the starting position. Repeat 2 times. Complete this exercise 3 times per week. Exercise L: Forearm Rotation, Pronation  1. Sit with your left / right forearm supported on a table and your hand resting palm-up. Your elbow should be at your side, bent to an "L" shape (about 90 degrees), and below the level of your shoulder. Keep your wrist stable. Do not allow it to move backward or forward during the exercise. 2. Gently hold a lightweight hammer with  your left / right hand. 3. Without moving your elbow or wrist, slowly rotate your palm and hand upward to a thumbs-up position. 4. Hold this position for 3 seconds. 5. Slowly return your forearm to the starting position. Repeat 2 times. Complete this exercise 3 times per week. Exercise M: Grip Strengthening  1. Hold one of these items in your left / right hand: play dough, therapy putty, a dense sponge, a stress ball, or a large, rolled sock. 2. Squeeze as hard as you can without increasing pain. 3. Hold this position for 5 seconds. 4. Slowly release your grip. Repeat 2 times. Complete this exercise 3 times per week.  This information is not intended to replace advice given to you by your health care provider. Make sure you discuss any questions you have with your health care provider. Document Released: 06/06/2005 Document Revised: 04/16/2016 Document Reviewed: 04/17/2015 Elsevier Interactive Patient Education  [employer].

## 2019-09-27 ENCOUNTER — Ambulatory Visit: Payer: BC Managed Care – PPO | Attending: Internal Medicine

## 2019-09-27 DIAGNOSIS — Z23 Encounter for immunization: Secondary | ICD-10-CM

## 2019-09-27 NOTE — Progress Notes (Signed)
   Covid-19 Vaccination Clinic  Name:  Michael Solomon    MRN: [id number] DOB: 03/02/1971  09/27/2019  [name] was observed post Covid-19 immunization for 15 minutes without incidence. He was provided with Vaccine Information Sheet and instruction to access the V-Safe system.   [name] was instructed to call 911 with any severe reactions post vaccine: Marland Kitchen Difficulty breathing  . Swelling of your face and throat  . A fast heartbeat  . A bad rash all over your body  . Dizziness and weakness    Immunizations Administered    Name Date Dose VIS Date Route   Pfizer COVID-19 Vaccine 09/27/2019  3:54 PM 0.3 mL 07/17/2019 Intramuscular   Manufacturer: [place]   Lot: [id number]   [place]: [id number]

## 2019-10-20 ENCOUNTER — Ambulatory Visit: Payer: BC Managed Care – PPO | Attending: Internal Medicine

## 2019-10-20 DIAGNOSIS — Z23 Encounter for immunization: Secondary | ICD-10-CM

## 2019-10-23 ENCOUNTER — Telehealth: Payer: Self-pay | Admitting: *Deleted

## 2019-10-23 NOTE — Telephone Encounter (Signed)
Prior authorization done on cover my meds for cimzia

## 2019-10-27 NOTE — Telephone Encounter (Signed)
This encounter was created in error - please disregard.

## 2019-10-27 NOTE — Progress Notes (Signed)
Outcome Approved on March 19 Request Reference Number: [id number]. CIMZIA PREFL KIT 200MG/ML is approved through 10/22/2020. Your patient may now fill this prescription and it will be covered.

## 2019-11-11 ENCOUNTER — Other Ambulatory Visit: Payer: Self-pay | Admitting: Family Medicine

## 2019-11-11 MED ORDER — MELOXICAM 15 MG PO TABS: ORAL_TABLET | ORAL | 1 refills | Status: DC

## 2019-11-23 ENCOUNTER — Telehealth: Payer: Self-pay | Admitting: Dermatology

## 2019-11-23 NOTE — Telephone Encounter (Signed)
Calling to speak with nurse about renewal of PA for patients Cimzia

## 2019-11-23 NOTE — Telephone Encounter (Signed)
The person calling was from the drug manufacturer calling to see if we needed help with starting another PA.

## 2019-11-25 ENCOUNTER — Telehealth: Payer: Self-pay | Admitting: *Deleted

## 2019-11-25 NOTE — Telephone Encounter (Signed)
Prior Authorization for Michael Solomon has already been done and has been approved until 10/22/2020

## 2019-11-27 ENCOUNTER — Telehealth: Payer: Self-pay | Admitting: Family Medicine

## 2019-11-27 ENCOUNTER — Telehealth: Payer: Self-pay

## 2019-11-27 DIAGNOSIS — F419 Anxiety disorder, unspecified: Secondary | ICD-10-CM

## 2019-11-27 MED ORDER — CLONAZEPAM 0.5 MG PO TABS: ORAL_TABLET | ORAL | 2 refills | Status: DC

## 2019-11-27 NOTE — Telephone Encounter (Signed)
Patient called in to see if Dr. Nani Ravens can send in a prescription for  clonazePAM (KLONOPIN) 0.5 MG tablet [[id number]]    Please send it to  [facility], [address]  [place], [place] [address]  Phone:  [phone] Fax:  [phone]  DEA #:  --

## 2019-11-27 NOTE — Telephone Encounter (Signed)
This is a [place] refill request Received from pharmacy today already and sent to PCP for review.

## 2019-11-27 NOTE — Telephone Encounter (Signed)
Requesting:   clonazepam Contract:   03/05/2020 UDS:   03/05/2020 Last Visit:    09/08/2019 Next Visit:   none Last Refill:    #60 with 2 refills on 08/28/2019  Please Advise

## 2019-12-03 NOTE — Telephone Encounter (Signed)
Erin with [employer] calling to calling about approval for Cimzia.  Junie Panning was told that medication has been approved.  Case # [id number].  Chart # [id number]

## 2020-02-02 ENCOUNTER — Other Ambulatory Visit: Payer: Self-pay | Admitting: Family Medicine

## 2020-02-02 MED ORDER — PAROXETINE HCL 40 MG PO TABS: 40.0000 mg | ORAL_TABLET | ORAL | 1 refills | Status: DC

## 2020-03-28 ENCOUNTER — Telehealth: Payer: Self-pay | Admitting: Family Medicine

## 2020-03-28 DIAGNOSIS — F419 Anxiety disorder, unspecified: Secondary | ICD-10-CM

## 2020-03-28 MED ORDER — CLONAZEPAM 0.5 MG PO TABS: ORAL_TABLET | ORAL | 2 refills | Status: DC

## 2020-03-28 NOTE — Telephone Encounter (Signed)
Clonazepam Refill request Last OV--09/08/2019--No upcoming scheduled at this time Last RF--11/27/2019--#60 with 2 refills UDS/CSC done on 03/06/2019

## 2020-03-28 NOTE — Telephone Encounter (Signed)
Pt due for 6 mo appt/CPE, ty.

## 2020-03-28 NOTE — Telephone Encounter (Signed)
Called the patient informed of PCP instructions. He did schedule appt.for this Friday 04/01/2020

## 2020-04-01 ENCOUNTER — Encounter: Payer: Self-pay | Admitting: Family Medicine

## 2020-04-01 ENCOUNTER — Ambulatory Visit: Payer: BC Managed Care – PPO | Admitting: Family Medicine

## 2020-04-01 ENCOUNTER — Other Ambulatory Visit: Payer: Self-pay

## 2020-04-01 VITALS — BP 142/86 | HR 108 | Temp 98.9°F | Ht 67.0 in | Wt 256.2 lb

## 2020-04-01 DIAGNOSIS — L400 Psoriasis vulgaris: Secondary | ICD-10-CM | POA: Diagnosis not present

## 2020-04-01 DIAGNOSIS — F419 Anxiety disorder, unspecified: Secondary | ICD-10-CM | POA: Diagnosis not present

## 2020-04-01 MED ORDER — PREDNISONE 10 MG PO TABS: ORAL_TABLET | ORAL | 0 refills | Status: DC

## 2020-04-01 MED ORDER — BUSPIRONE HCL 10 MG PO TABS: 10.0000 mg | ORAL_TABLET | Freq: Two times a day (BID) | ORAL | 2 refills | Status: DC

## 2020-04-01 NOTE — Patient Instructions (Signed)
Aim to do some physical exertion for 150 minutes per week. This is typically divided into 5 days per week, 30 minutes per day. The activity should be enough to get your heart rate up. Anything is better than nothing if you have time constraints.  Please consider counseling. Contact [phone] to schedule an appointment or inquire about cost/insurance coverage.  If you do not hear anything about your referral in the next 1-2 weeks, call our office and ask for an update.  [facility] [address] [place], [place] [address] [phone]  [facility] [address] [place], [place] [address] [phone]  [facility] [place],  [address] [phone]  [facility] [address], [place], [place], #[phone] [address], [place], [place], [place]  [facility] [place], [place], [place], [place]  [facility] [place], [place]  Call one of these offices sooner than later as it can take 2-3 months to get a new patient appointment.   Coping skills Choose 5 that work for you:  Take a deep breath  Count to 20  Read a book  Do a puzzle  Meditate  Bake  Sing  Knit  Garden  Pray  Go outside  Call a friend  Listen to music  Take a walk  Color  Send a note  Take a bath  Watch a movie  Be alone in a quiet place  Pet an animal  Visit a friend  Journal  Exercise  Stretch   Let us know if you need anything.

## 2020-04-01 NOTE — Progress Notes (Signed)
Chief Complaint  Patient presents with  . Follow-up    6 month    Subjective Michael Solomon presents for f/u anxiety/depression.  Pt is currently being treated with Paxil 40 mg/d, Klonopin 0.5 mg bid prn.  Reports not doing well since treatment. No thoughts of harming self or others. No self-medication with alcohol, prescription drugs or illicit drugs. Pt is not following with a counselor/psychologist.  Patient has a history of plaque psoriasis.  He was following with dermatology but is not pleased with the care.  He is not followed up with anybody recently.  His psoriasis affects most of his body and is quite painful.  Was itching causes bleeding.  Past Medical History:  Diagnosis Date  . Anxiety   . Arthritis   . Arthritis   . Cataract   . Chronic kidney disease   . Depression   . Hypertension   . OSA (obstructive sleep apnea) 06/24/2017  . Plaque psoriasis    Allergies as of 04/01/2020   No Known Allergies     Medication List       Accurate as of April 01, 2020  1:03 PM. If you have any questions, ask your nurse or doctor.        STOP taking these medications   meloxicam 15 MG tablet Commonly known as: MOBIC Stopped by: Shelda Pal, DO     TAKE these medications   ammonium lactate 12 % lotion Commonly known as: LAC-HYDRIN APPLY TO THE AFFECTED AREAS OF SKIN BID PRN   busPIRone 10 MG tablet Commonly known as: BUSPAR Take 1 tablet (10 mg total) by mouth 2 (two) times daily. Started by: Shelda Pal, DO   clobetasol cream 0.05 % Commonly known as: TEMOVATE Apply 1 application topically 2 (two) times daily.   clobetasol 0.05 % topical foam Commonly known as: OLUX Apply topically 2 (two) times daily.   clonazePAM 0.5 MG tablet Commonly known as: KLONOPIN TAKE 1 TABLET(0.5 MG) BY MOUTH TWICE DAILY AS NEEDED FOR ANXIETY   PARoxetine 40 MG tablet Commonly known as: PAXIL Take 1 tablet (40 mg total) by mouth every morning.     potassium chloride 10 MEQ tablet Commonly known as: Klor-Con M10 Take 1 tablet (10 mEq total) by mouth 2 (two) times daily.   predniSONE 10 MG tablet Commonly known as: DELTASONE Take 6 tabs for 2 days then 5 days for 2 days until you are down to 0. Started by: Shelda Pal, DO   tadalafil 5 MG tablet Commonly known as: CIALIS TAKE 1 TABLET(5 MG) BY MOUTH DAILY AS NEEDED FOR ERECTILE DYSFUNCTION   Taltz 80 MG/ML So[place] Generic drug: Ixekizumab   TALZENNA PO Take by mouth.       Exam BP (!) 142/86 (BP Location: Right Arm, Patient Position: Sitting, Cuff Size: Large)   Pulse (!) 108   Temp 98.9 F (37.2 C) (Oral)   Ht 5' 7"  (1.702 m)   Wt 256 lb 4 oz (116.2 kg)   SpO2 96%   BMI 40.13 kg/m  General:  well developed, well nourished, in no apparent distress Heart: RRR Skin: See below. Lungs:  CTAB. No respiratory distress Psych: well oriented with normal range of affect and age-appropriate judgement/insight, alert and oriented x4.    Assessment and Plan      Anxiety - Plan: busPIRone (BUSPAR) 10 MG tablet  Plaque psoriasis - Plan: predniSONE (DELTASONE) 10 MG tablet, Ambulatory referral to Dermatology  1.  Continue Paxil and Klonopin, counseling  resources in addition to psychiatric resources provided.  Add BuSpar.   2.  Prednisone taper starting at 60 mg daily for 2 days, 50 mg daily for 2 days etc. we will see if he can get in with a new dermatologist closer to the area. F/u in 1 mo to recheck above in blood pressure. The patient voiced understanding and agreement to the plan.  [place], DO 04/01/20 1:03 PM

## 2020-04-29 DIAGNOSIS — L409 Psoriasis, unspecified: Secondary | ICD-10-CM | POA: Diagnosis not present

## 2020-04-29 DIAGNOSIS — L4050 Arthropathic psoriasis, unspecified: Secondary | ICD-10-CM | POA: Diagnosis not present

## 2020-04-29 DIAGNOSIS — R5382 Chronic fatigue, unspecified: Secondary | ICD-10-CM | POA: Diagnosis not present

## 2020-04-29 DIAGNOSIS — F419 Anxiety disorder, unspecified: Secondary | ICD-10-CM | POA: Diagnosis not present

## 2020-05-02 DIAGNOSIS — L400 Psoriasis vulgaris: Secondary | ICD-10-CM | POA: Diagnosis not present

## 2020-05-03 DIAGNOSIS — E785 Hyperlipidemia, unspecified: Secondary | ICD-10-CM | POA: Diagnosis not present

## 2020-05-03 DIAGNOSIS — L400 Psoriasis vulgaris: Secondary | ICD-10-CM | POA: Diagnosis not present

## 2020-05-03 DIAGNOSIS — R531 Weakness: Secondary | ICD-10-CM | POA: Diagnosis not present

## 2020-05-06 ENCOUNTER — Ambulatory Visit: Payer: BC Managed Care – PPO | Admitting: Family Medicine

## 2020-05-06 ENCOUNTER — Other Ambulatory Visit: Payer: Self-pay

## 2020-05-06 ENCOUNTER — Ambulatory Visit (INDEPENDENT_AMBULATORY_CARE_PROVIDER_SITE_OTHER): Payer: BC Managed Care – PPO | Admitting: Family Medicine

## 2020-05-06 ENCOUNTER — Encounter: Payer: Self-pay | Admitting: Family Medicine

## 2020-05-06 VITALS — BP 140/80 | HR 105 | Temp 98.3°F | Ht 67.0 in | Wt 238.2 lb

## 2020-05-06 DIAGNOSIS — M25562 Pain in left knee: Secondary | ICD-10-CM

## 2020-05-06 DIAGNOSIS — M25561 Pain in right knee: Secondary | ICD-10-CM

## 2020-05-06 DIAGNOSIS — I10 Essential (primary) hypertension: Secondary | ICD-10-CM | POA: Diagnosis not present

## 2020-05-06 DIAGNOSIS — F419 Anxiety disorder, unspecified: Secondary | ICD-10-CM | POA: Diagnosis not present

## 2020-05-06 MED ORDER — LISINOPRIL 20 MG PO TABS: 20.0000 mg | ORAL_TABLET | Freq: Every day | ORAL | 3 refills | Status: DC

## 2020-05-06 MED ORDER — TRAMADOL HCL 50 MG PO TABS: 50.0000 mg | ORAL_TABLET | Freq: Four times a day (QID) | ORAL | 2 refills | Status: DC | PRN

## 2020-05-06 NOTE — Progress Notes (Signed)
Chief Complaint  Patient presents with  . Follow-up    Subjective Michael Solomon is a 49 y.o. male who presents for hypertension follow up. He does not monitor home blood pressures. He is not on medication current. He is sometimes adhering to a healthy diet overall.  Current exercise: none currently.   Pt unsure how he is doing on BuSpar as he has been on high dose pred for several weeks now due to psoriasis flare. Gets angry and agitated. Situation with skin does not help.  He has pain in both of his knees. He is following with rheum and derm for psoriasis and psoriatic arthritis. Currently on 60 mg/d of pred. No new inj or change in activity.    Past Medical History:  Diagnosis Date  . Anxiety   . Arthritis   . Arthritis   . Cataract   . Chronic kidney disease   . Depression   . Hypertension   . OSA (obstructive sleep apnea) 06/24/2017  . Plaque psoriasis     Exam BP 140/80 (BP Location: Left Arm, Patient Position: Sitting, Cuff Size: Large)   Pulse (!) 105   Temp 98.3 F (36.8 C) (Oral)   Ht 5' 7"  (1.702 m)   Wt 238 lb 4 oz (108.1 kg)   SpO2 98%   BMI 37.32 kg/m  General:  well developed, well nourished, in no apparent distress Heart: RRR, no bruits, no LE edema Lungs: clear to auscultation, no accessory muscle use Psych: Flat affect, appropriate judgment and insight.   Essential hypertension - Plan: lisinopril (ZESTRIL) 20 MG tablet  Anxiety  Pain in both knees, unspecified chronicity - Plan: traMADol (ULTRAM) 50 MG tablet  1. Add back lisinopril 20 mg/d. Counseled on diet and exercise. 2. Cont BuSpar 10 mg bid, Klonopin 0.5 mg bid prn, Paxil 40 mg/d. [facility] info and psych self-referral info provided.  3. Add Tramadol 50 mg bid prn, no new serotonin modulators.  F/u in 6 weeks to reck BP. The patient voiced understanding and agreement to the plan.  [place], DO 05/06/20  12:19 PM

## 2020-05-06 NOTE — Patient Instructions (Addendum)
Let's keep medicine the same for now since you have been on prednisone.  Keep the diet clean and stay active.  I still recommend a counselor/psychologist and a psychiatrist.  Please consider counseling. Contact [phone] to schedule an appointment or inquire about cost/insurance coverage.  [facility] [address] [place], [place] [address] [phone]  [facility] [address] [place], [place] [address] [phone]  [facility] [place], [place] [address] [phone]  [facility] [address], [place], [place], #[phone] [address], [place], [place], [place]  [facility] [place], [place] and [place], [place], [place]  [facility] [place], [place]  Call one of these offices sooner than later as it can take 2-3 months to get a new patient appointment.

## 2020-05-11 ENCOUNTER — Encounter: Payer: Self-pay | Admitting: Family Medicine

## 2020-05-13 ENCOUNTER — Telehealth: Payer: Self-pay | Admitting: Family Medicine

## 2020-05-13 NOTE — Telephone Encounter (Signed)
PCP completed medical Leave paperwork. The patients wife will pickup at  [employer] as patient needs to complete his part. Made a copy for scan

## 2020-05-27 ENCOUNTER — Telehealth: Payer: Self-pay | Admitting: Family Medicine

## 2020-05-27 NOTE — Telephone Encounter (Signed)
Patient got his new Psoriasis medication on Wednesday. His wife states still having some problems walking and would like a steroid  To help until new medication starts working.

## 2020-05-27 NOTE — Telephone Encounter (Signed)
Called informed the patients wife of PCP response

## 2020-05-27 NOTE — Telephone Encounter (Signed)
I'd like to, but don't want to interfere with new medicine, please direct pt to team prescribing medication. TY.

## 2020-06-03 ENCOUNTER — Other Ambulatory Visit: Payer: Self-pay

## 2020-06-03 MED ORDER — MELOXICAM 15 MG PO TABS: ORAL_TABLET | ORAL | 1 refills | Status: DC

## 2020-06-20 ENCOUNTER — Other Ambulatory Visit: Payer: Self-pay | Admitting: Family Medicine

## 2020-06-20 ENCOUNTER — Encounter: Payer: Self-pay | Admitting: *Deleted

## 2020-06-20 MED ORDER — CLOBETASOL PROPIONATE 0.05 % EX CREA
1.0000 | TOPICAL_CREAM | Freq: Two times a day (BID) | CUTANEOUS | 1 refills | Status: DC
Start: 2020-06-20 — End: 2022-10-03

## 2020-06-21 ENCOUNTER — Telehealth: Payer: Self-pay | Admitting: Family Medicine

## 2020-06-21 DIAGNOSIS — F419 Anxiety disorder, unspecified: Secondary | ICD-10-CM

## 2020-06-21 MED ORDER — CLONAZEPAM 0.5 MG PO TABS: ORAL_TABLET | ORAL | 2 refills | Status: DC

## 2020-06-21 NOTE — Telephone Encounter (Signed)
He's due for his f/u for BP. Ty.

## 2020-06-21 NOTE — Telephone Encounter (Signed)
Refill request for [facility] Last RF---#60 with 2 refills on 03/28/2020 Last OV--05/06/2020 CSC/UDS---03/05/2020

## 2020-06-21 NOTE — Telephone Encounter (Signed)
Called informed the patients wife of rx sent in///appt due. She will get with him and call back to schedule that appointment

## 2020-09-12 ENCOUNTER — Other Ambulatory Visit: Payer: Self-pay | Admitting: Family Medicine

## 2020-09-12 DIAGNOSIS — I10 Essential (primary) hypertension: Secondary | ICD-10-CM

## 2020-09-12 MED ORDER — LISINOPRIL 20 MG PO TABS: 20.0000 mg | ORAL_TABLET | Freq: Every day | ORAL | 3 refills | Status: DC

## 2020-09-26 ENCOUNTER — Telehealth: Payer: Self-pay | Admitting: Family Medicine

## 2020-09-26 DIAGNOSIS — F419 Anxiety disorder, unspecified: Secondary | ICD-10-CM

## 2020-09-26 MED ORDER — CLONAZEPAM 0.5 MG PO TABS: ORAL_TABLET | ORAL | 5 refills | Status: DC

## 2020-09-26 NOTE — Telephone Encounter (Signed)
Refill request for Clonazepam Last OV--05/06/2020 Last RF--06/21/2020  #60 with 2 refills CSC/UDS done on 03/06/2019

## 2021-02-20 ENCOUNTER — Ambulatory Visit: Payer: BC Managed Care – PPO | Admitting: Family Medicine

## 2021-02-21 ENCOUNTER — Ambulatory Visit: Payer: BC Managed Care – PPO | Admitting: Family Medicine

## 2021-02-21 ENCOUNTER — Encounter: Payer: Self-pay | Admitting: Family Medicine

## 2021-02-21 ENCOUNTER — Other Ambulatory Visit: Payer: Self-pay

## 2021-02-21 VITALS — BP 134/90 | HR 79 | Temp 98.3°F | Ht 67.0 in | Wt 274.2 lb

## 2021-02-21 DIAGNOSIS — M722 Plantar fascial fibromatosis: Secondary | ICD-10-CM | POA: Diagnosis not present

## 2021-02-21 DIAGNOSIS — J40 Bronchitis, not specified as acute or chronic: Secondary | ICD-10-CM | POA: Diagnosis not present

## 2021-02-21 MED ORDER — PREDNISONE 20 MG PO TABS: 40.0000 mg | ORAL_TABLET | Freq: Every day | ORAL | 0 refills | Status: AC

## 2021-02-21 MED ORDER — TRAMADOL HCL 50 MG PO TABS: 50.0000 mg | ORAL_TABLET | Freq: Four times a day (QID) | ORAL | 2 refills | Status: DC | PRN

## 2021-02-21 MED ORDER — PHENTERMINE HCL 37.5 MG PO CAPS
37.5000 mg | ORAL_CAPSULE | ORAL | 0 refills | Status: DC
Start: 2021-02-21 — End: 2021-03-24

## 2021-02-21 NOTE — Progress Notes (Signed)
Musculoskeletal Exam  Patient: Kojo Liby DOB: [birth year]  DOS: 02/21/2021  SUBJECTIVE:  Chief Complaint:   Chief Complaint  Patient presents with   Foot Pain    Left foot pain     Daryl Wrightsman is a 50 y.o.  male for evaluation and treatment of foot pain.  He is here with his wife.  Onset:  3 weeks ago. No inj or change in activity.  Location: L heel Character:  sharp  Progression of issue:  is unchanged Associated symptoms: difficulty walking Denies: Bruising, redness, swelling Rates pain 10/10 when it flares Treatment: to date has been: [facility].   Neurovascular symptoms: no  Wheezing-over the last several weeks, he has been having wheezing and some cough.  He has no history of asthma or COPD.  He does have allergies which seem to flare this.  No shortness of breath, sore throat, or fevers.  No sick contacts.  The patient has gained around 45 pounds in the last 9 months since I have seen him last.  He is requesting a medicine to help him lose weight.  Past Medical History:  Diagnosis Date   Anxiety    Arthritis    Arthritis    Cataract    Chronic kidney disease    Depression    Hypertension    OSA (obstructive sleep apnea) 06/24/2017   Plaque psoriasis     Objective: VITAL SIGNS: BP 134/90   Pulse 79   Temp 98.3 F (36.8 C) (Oral)   Ht 5' 7"  (1.702 m)   Wt 274 lb 4 oz (124.4 kg)   SpO2 92%   BMI 42.95 kg/m  Constitutional: Well formed, well developed. No acute distress. Thorax & Lungs: No accessory muscle use, CTAB. No wheezing heard. Heart: RRR Musculoskeletal: L heel.   Tenderness to palpation: yes, over prox insertion of the plantar fascia over the medial heel Deformity: no Ecchymosis: no Neurologic: Normal sensory function. Antalgic gait.  Psychiatric: Normal mood. Age appropriate judgment and insight. Alert & oriented x 3.    Assessment:  Plantar fasciitis - Plan: predniSONE (DELTASONE) 20 MG tablet, traMADol (ULTRAM) 50 MG tablet,  Ambulatory referral to Physical Therapy  Wheezy bronchitis  Morbid obesity ([place]) - Plan: phentermine 37.5 MG capsule  Plan: Refer to physical therapy.  [place] sock.  Stretches/exercises, heat, ice, Tylenol.  Prednisone burst. 5 days of prednisone, 40 mg daily.  Consider antiallergy medicine prior to going outside. Chronic, uncontrolled.  Trial phentermine.  Return in 30 days. I have discharged his spouse from our office.  At the end there was an awkward conversation about my reasoning.  This is well-documented. F/u in 1 mo. The patient voiced understanding and agreement to the plan.   [place], DO 02/21/21  12:06 PM

## 2021-02-21 NOTE — Patient Instructions (Addendum)
Ice/cold pack over area for 10-15 min twice daily.  OK to take Tylenol 1000 mg (2 extra strength tabs) or 975 mg (3 regular strength tabs) every 6 hours as needed.  Hold on the meloxicam while on the prednisone.   Consider getting a [place] sock.   If you do not hear anything about your referral in the next 1-2 weeks, call our office and ask for an update.  Take an allergy pill like Zyrtec prior to working in the yard.  Plantar Fasciitis Stretches/exercises Do exercises exactly as told by your health care provider and adjust them as directed. It is normal to feel mild stretching, pulling, tightness, or discomfort as you do these exercises, but you should stop right away if you feel sudden pain or your pain gets worse.   Stretching and range of motion exercises These exercises warm up your muscles and joints and improve the movement and flexibility of your foot. These exercises also help to relieve pain.  Exercise A: Plantar fascia stretch Sit with your left / right leg crossed over your opposite knee. Hold your heel with one hand with that thumb near your arch. With your other hand, hold your toes and gently pull them back toward the top of your foot. You should feel a stretch on the bottom of your toes or your foot or both. Hold this stretch for 30 seconds. Slowly release your toes and return to the starting position. Repeat 2 times. Complete this exercise 3 times per week.  Exercise B: Gastroc, standing Stand with your hands against a wall. Extend your left / right leg behind you, and bend your front knee slightly. Keeping your heels on the floor and keeping your back knee straight, shift your weight toward the wall without arching your back. You should feel a gentle stretch in your left / right calf. Hold this position for 30 seconds. Repeat 2 times. Complete this exercise 3 times a week. Exercise C: Soleus, standing Stand with your hands against a wall. Extend your left /  right leg behind you, and bend your front knee slightly. Keeping your heels on the floor, bend your back knee and slightly shift your weight over the back leg. You should feel a gentle stretch deep in your calf. Hold this position for 30 seconds. Repeat 2 times. Complete this exercise 3 times per week. Exercise D: Gastrocsoleus, standing Stand with the ball of your left / right foot on a step. The ball of your foot is on the walking surface, right under your toes. Keep your other foot firmly on the same step. Hold onto the wall or a railing for balance. Slowly lift your other foot, allowing your body weight to press your heel down over the edge of the step. You should feel a stretch in your left / right calf. Hold this position for 30 seconds. Return both feet to the step. Repeat this exercise with a slight bend in your left / right knee. Repeat 2 times with your left / right knee straight and 2times with your left / right knee bent. Complete this exercise 3 times a week.  Balance exercise This exercise builds your balance and strength control of your arch to help take pressure off your plantar fascia. Exercise E: Single leg stand Without shoes, stand near a railing or in a doorway. You may hold onto the railing or door frame as needed. Stand on your left / right foot. Keep your big toe down on the floor and  try to keep your arch lifted. Do not let your foot roll inward. Hold this position for 30 seconds. If this exercise is too easy, you can try it with your eyes closed or while standing on a pillow. Repeat 2 times. Complete this exercise 3 times per week. This information is not intended to replace advice given to you by your health care provider. Make sure you discuss any questions you have with your health care provider. Document Released: 07/23/2005 Document Revised: 03/27/2016 Document Reviewed: 06/06/2015 Elsevier Interactive Patient Education  2017 [employer].

## 2021-02-27 ENCOUNTER — Other Ambulatory Visit: Payer: Self-pay | Admitting: Family Medicine

## 2021-02-27 MED ORDER — MELOXICAM 15 MG PO TABS: ORAL_TABLET | ORAL | 0 refills | Status: DC

## 2021-03-24 ENCOUNTER — Other Ambulatory Visit: Payer: Self-pay | Admitting: Family Medicine

## 2021-03-24 MED ORDER — PHENTERMINE HCL 37.5 MG PO CAPS: 37.5000 mg | ORAL_CAPSULE | ORAL | 0 refills | Status: DC

## 2021-04-05 ENCOUNTER — Telehealth: Payer: Self-pay | Admitting: Family Medicine

## 2021-04-05 NOTE — Telephone Encounter (Signed)
Patient called stating his pharmacy did not have either one of his prescriptions for Phentermine and tramadol. He stated that he had lost his prescription for phentermine but he had contacted the pharmacy to contact us to get the approval since he lost the scrip, but he has yet to hear back from them. He also asked for the tramadol and they stated they had not received anything. His pharmacy is [facility] on [address]. Please advice.

## 2021-04-05 NOTE — Telephone Encounter (Cosign Needed)
Last RF on phentermine--#30 no refills on 03/24/21 Last RF on Tramadol--#30 with 2 refills on 02/21/21 Last OV--02/21/21

## 2021-04-05 NOTE — Telephone Encounter (Signed)
Pharmacist from [facility] has called with concern over this request. There are 2 refills of Tramadol left//PCP ok that he can have those. Per PCP request to check data base on phentermine refill and the patient did only have this filled at their pharmacy on 02/21/21. I informed the pharmacist that if PCP agree to send in the phentermine he would do so.

## 2021-04-05 NOTE — Telephone Encounter (Signed)
No need to refill. He was supposed to return in 30 d but did not. Ty.

## 2021-04-05 NOTE — Telephone Encounter (Signed)
Called the pharmacy informed the pharmacist denial of refill and the reason why. She stated he and his wife would be coming in today for the refill on the Tramadol and she would let him know.

## 2021-04-14 ENCOUNTER — Other Ambulatory Visit: Payer: Self-pay | Admitting: Family Medicine

## 2021-04-14 DIAGNOSIS — I10 Essential (primary) hypertension: Secondary | ICD-10-CM

## 2021-04-14 MED ORDER — LISINOPRIL 20 MG PO TABS: 20.0000 mg | ORAL_TABLET | Freq: Every day | ORAL | 3 refills | Status: DC

## 2021-04-26 ENCOUNTER — Encounter: Payer: Self-pay | Admitting: Family Medicine

## 2021-04-26 ENCOUNTER — Other Ambulatory Visit: Payer: Self-pay

## 2021-04-26 ENCOUNTER — Ambulatory Visit: Payer: BC Managed Care – PPO | Admitting: Family Medicine

## 2021-04-26 DIAGNOSIS — Z23 Encounter for immunization: Secondary | ICD-10-CM | POA: Diagnosis not present

## 2021-04-26 DIAGNOSIS — F419 Anxiety disorder, unspecified: Secondary | ICD-10-CM

## 2021-04-26 DIAGNOSIS — M722 Plantar fascial fibromatosis: Secondary | ICD-10-CM | POA: Diagnosis not present

## 2021-04-26 MED ORDER — PAROXETINE HCL 40 MG PO TABS: 40.0000 mg | ORAL_TABLET | ORAL | 2 refills | Status: DC

## 2021-04-26 MED ORDER — PHENTERMINE HCL 37.5 MG PO CAPS: 37.5000 mg | ORAL_CAPSULE | ORAL | 0 refills | Status: DC

## 2021-04-26 MED ORDER — BUSPIRONE HCL 10 MG PO TABS: 10.0000 mg | ORAL_TABLET | Freq: Two times a day (BID) | ORAL | 2 refills | Status: DC

## 2021-04-26 MED ORDER — SPACER/AERO-HOLD CHAMBER MASK MISC: 2 refills | Status: AC

## 2021-04-26 MED ORDER — TRAMADOL HCL 50 MG PO TABS: 50.0000 mg | ORAL_TABLET | Freq: Four times a day (QID) | ORAL | 2 refills | Status: DC | PRN

## 2021-04-26 MED ORDER — CLONAZEPAM 0.5 MG PO TABS: ORAL_TABLET | ORAL | 5 refills | Status: DC

## 2021-04-26 MED ORDER — ALBUTEROL SULFATE HFA 108 (90 BASE) MCG/ACT IN AERS: 2.0000 | INHALATION_SPRAY | Freq: Four times a day (QID) | RESPIRATORY_TRACT | 0 refills | Status: DC | PRN

## 2021-04-26 MED ORDER — MELOXICAM 15 MG PO TABS: ORAL_TABLET | ORAL | 0 refills | Status: DC

## 2021-04-26 NOTE — Progress Notes (Signed)
Chief Complaint  Patient presents with   Follow-up    Subjective: Patient is a 50 y.o. male here for f/u. Here w spouse.   Started on phentermine 37.5 mg/d. Diet could be better. Active at work, no routine exercise. He lost around 8 lbs on the phentermine.  He tolerated it well overall other than some mild shaking.  He is interested in a refill.  His wife has had bariatric surgery and is encouraging it for him.  Past Medical History:  Diagnosis Date   Anxiety    Arthritis    Arthritis    Cataract    Chronic kidney disease    Depression    Hypertension    OSA (obstructive sleep apnea) 06/24/2017   Plaque psoriasis     Objective: BP 124/80   Pulse 83   Temp 98.6 F (37 C) (Oral)   Ht 5' 7"  (1.702 m)   Wt 280 lb 8 oz (127.2 kg)   SpO2 93%   BMI 43.93 kg/m  General: Awake, appears stated age Heart: RRR, no bruits Lungs: CTAB, no rales, wheezes or rhonchi. No accessory muscle use Psych: Age appropriate judgment and insight, normal affect and mood  Assessment and Plan: Morbid obesity ([place]) - Plan: Amb Referral to Bariatric Surgery, phentermine 37.5 MG capsule, phentermine 37.5 MG capsule, phentermine 37.5 MG capsule  Anxiety - Plan: busPIRone (BUSPAR) 10 MG tablet, clonazePAM (KLONOPIN) 0.5 MG tablet  Plantar fasciitis - Plan: traMADol (ULTRAM) 50 MG tablet  Need for influenza vaccination - Plan: Flu Vaccine QUAD 6+ mos PF IM (Fluarix Quad PF)  Chronic, unstable.  Continue phentermine for 3 months total.  Will refer to bariatric surgery for more definitive and long-term management.  Counseled on diet and exercise. I will see him in 3 months for a physical. The patient and his spouse voiced understanding and agreement to the plan.  [place], DO 04/26/21  11:08 AM

## 2021-04-26 NOTE — Patient Instructions (Signed)
If you do not hear anything about your referral in the next 1-2 weeks, call our office and ask for an update.  Keep the diet clean and stay active.  Aim to do some physical exertion for 150 minutes per week. This is typically divided into 5 days per week, 30 minutes per day. The activity should be enough to get your heart rate up. Anything is better than nothing if you have time constraints.  Let us know if you need anything.

## 2021-06-07 IMAGING — DX DG HAND COMPLETE 3+V*L*
3 series · 3 of 3 positions shown · non-contrast
Comparison: None.

CLINICAL DATA: Left hand stiffness for 3 weeks.

EXAM:
LEFT HAND - COMPLETE 3+ VIEW

[hand pa]
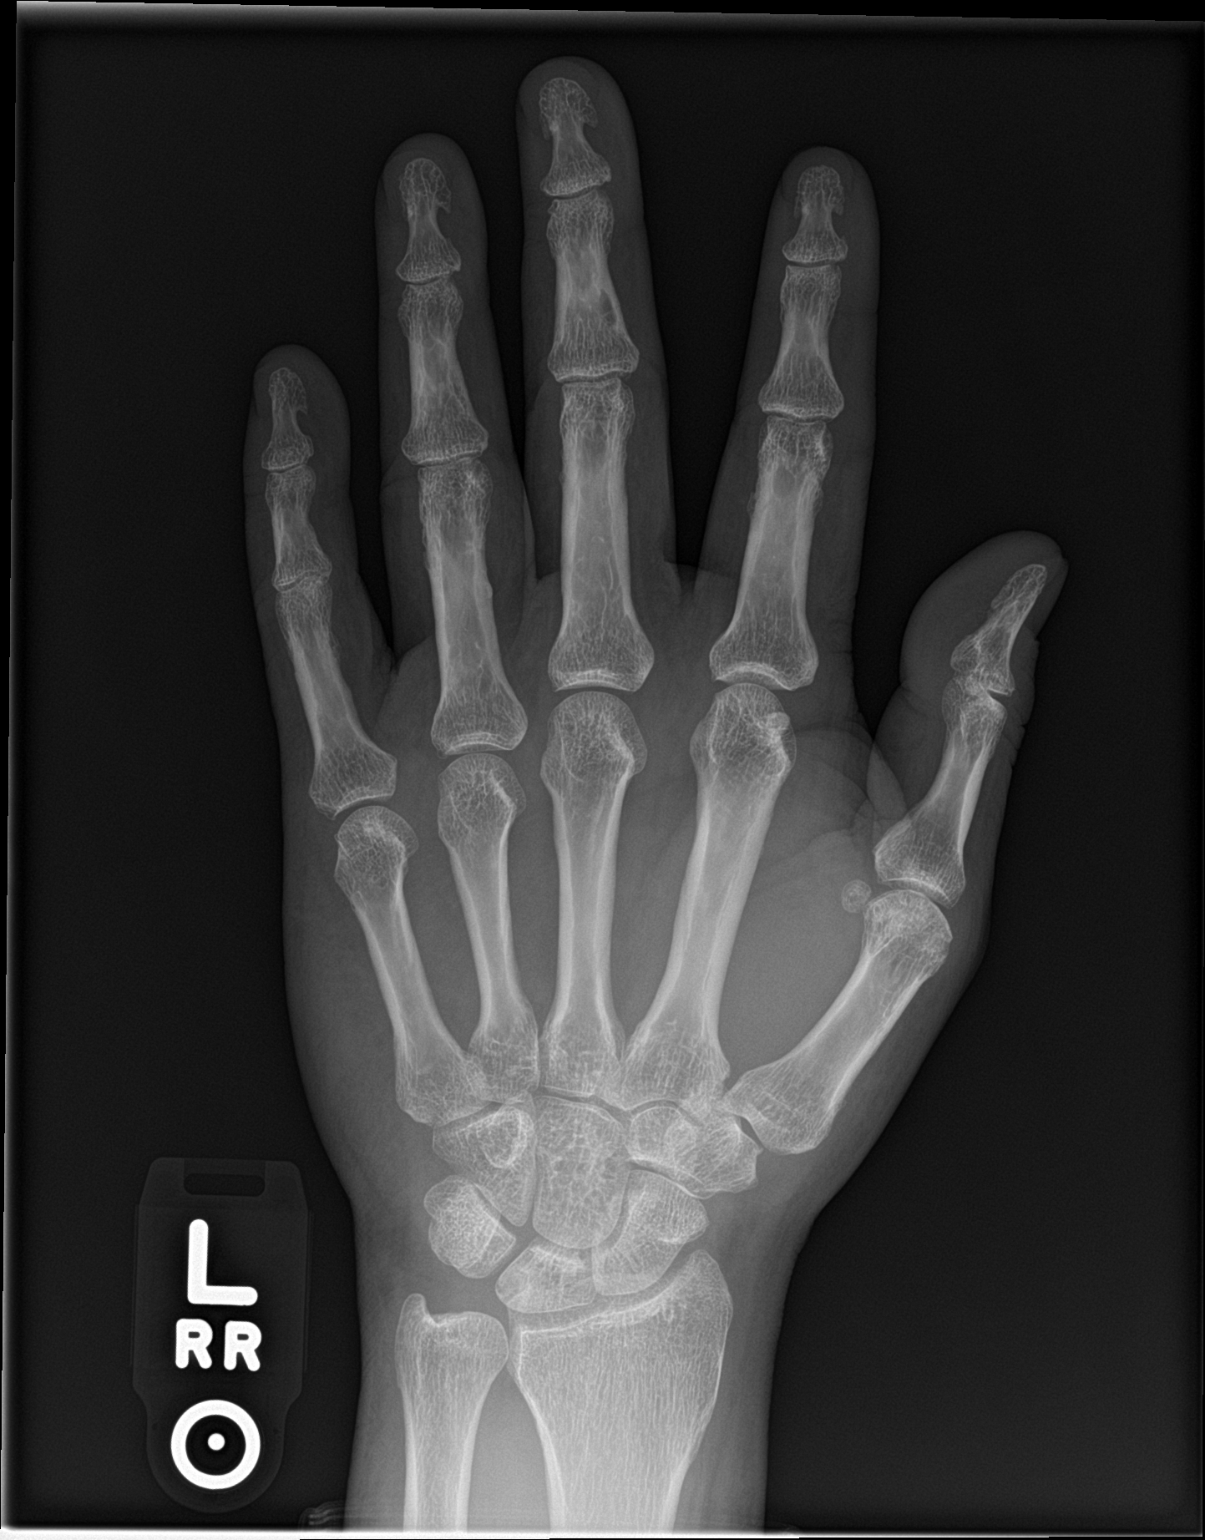

[hand obl]
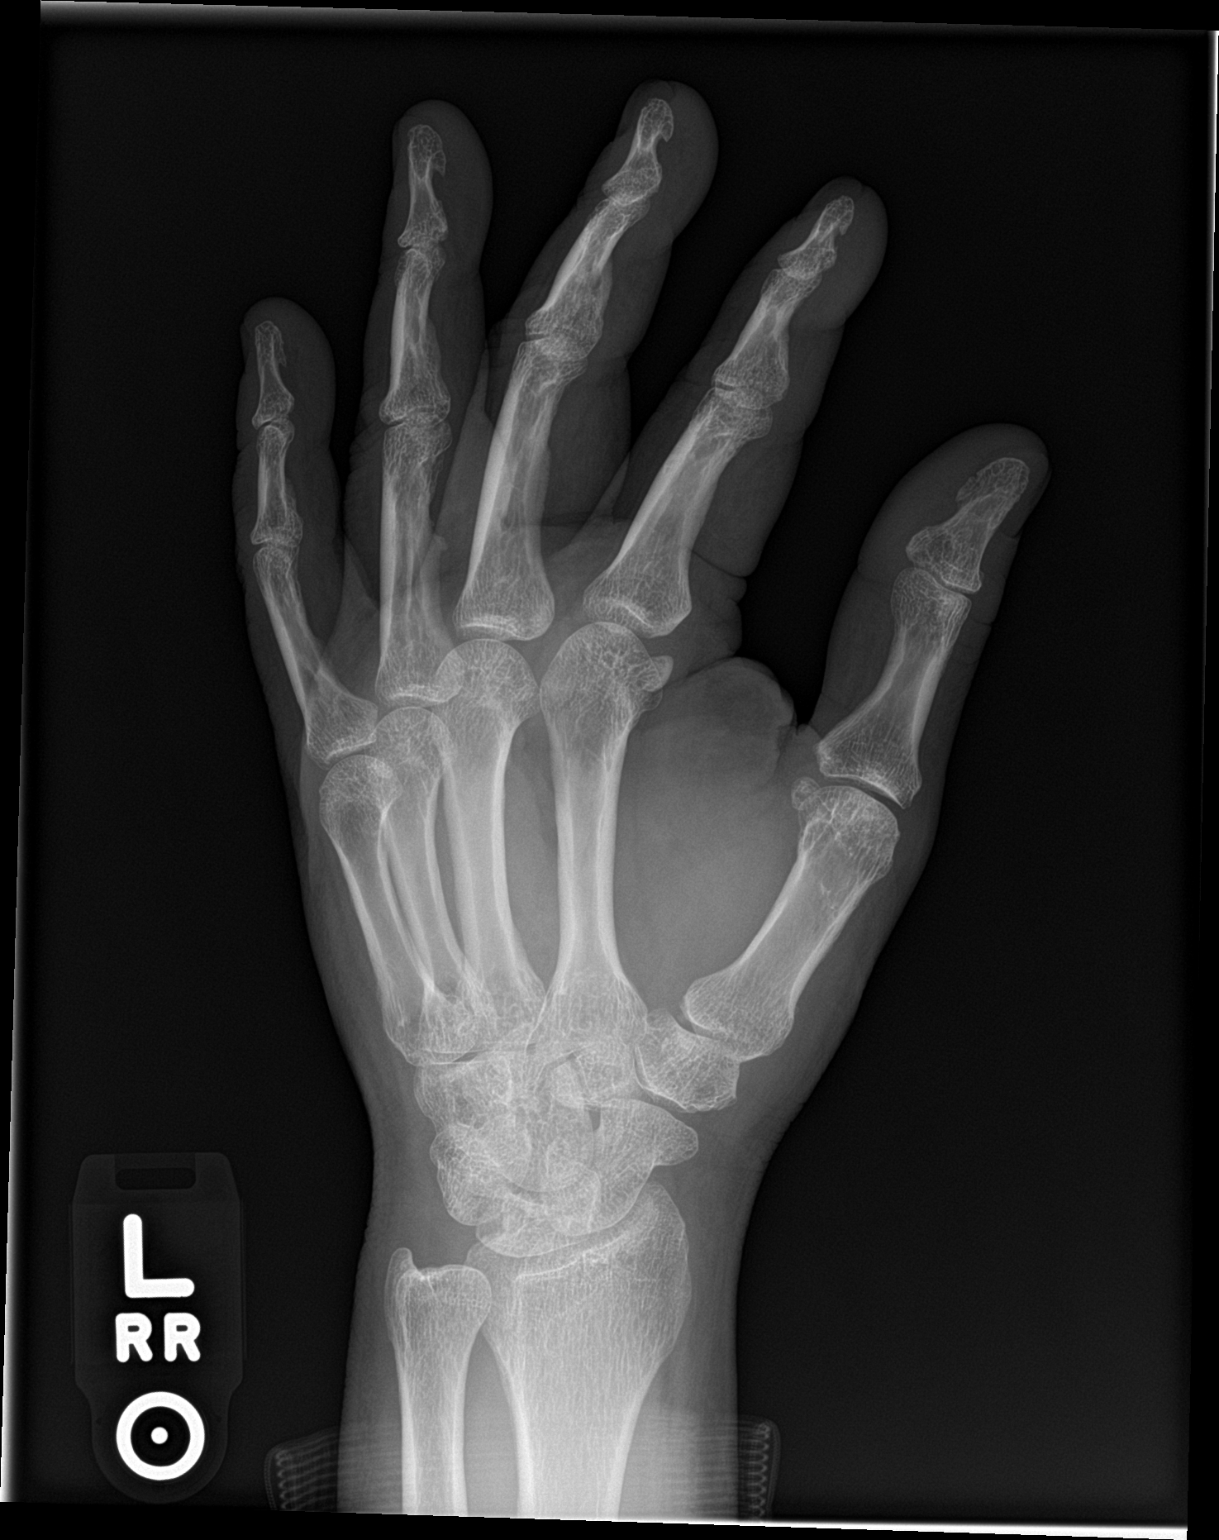

[hand lat]
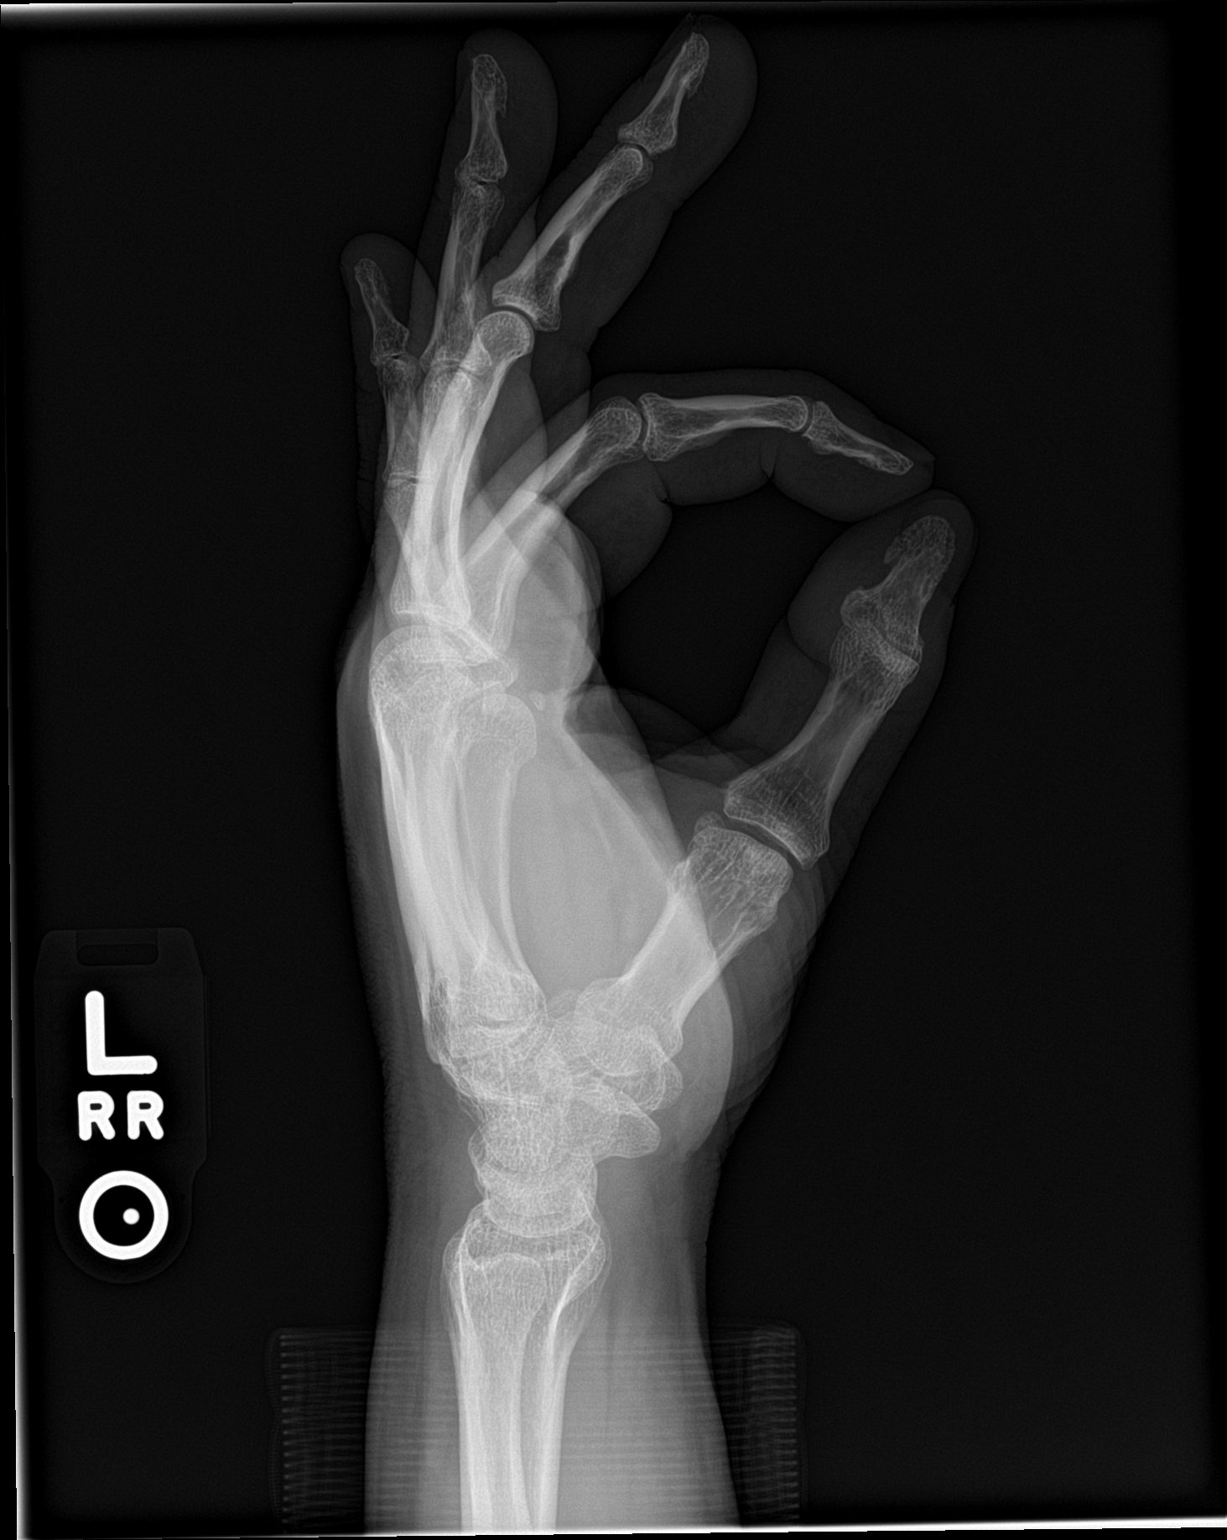

[3 of 3 positions shown; findings below may reference images not displayed]

FINDINGS: Normal visualized carpal bones. Normal first metacarpus. Normal
second through fifth metacarpi. Normal phalanges. There is no
demonstrated fracture.
Normal carpal articulations.
Normal carpometacarpal (CMC) articulation of the left thumb. Normal
metacarpophalangeal (MCP) joint of the left thumb. Normal
interphalangeal (IP) joint of the left thumb.
Normal second through fifth carpometacarpal (CMC) joints. Normal
second through fifth metacarpophalangeal (MCP) joints. Normal
proximal interphalangeal (PIP) and distal interphalangeal (DIP)
joints of the second through fifth fingers.
IMPRESSION: Normal left hand plain films.

## 2021-06-07 IMAGING — DX DG HAND COMPLETE 3+V*R*
3 series · 3 of 3 positions shown · non-contrast
Comparison: None.

CLINICAL DATA: Numbness in palm x3 weeks.

EXAM:
RIGHT HAND - COMPLETE 3+ VIEW

[hand pa]
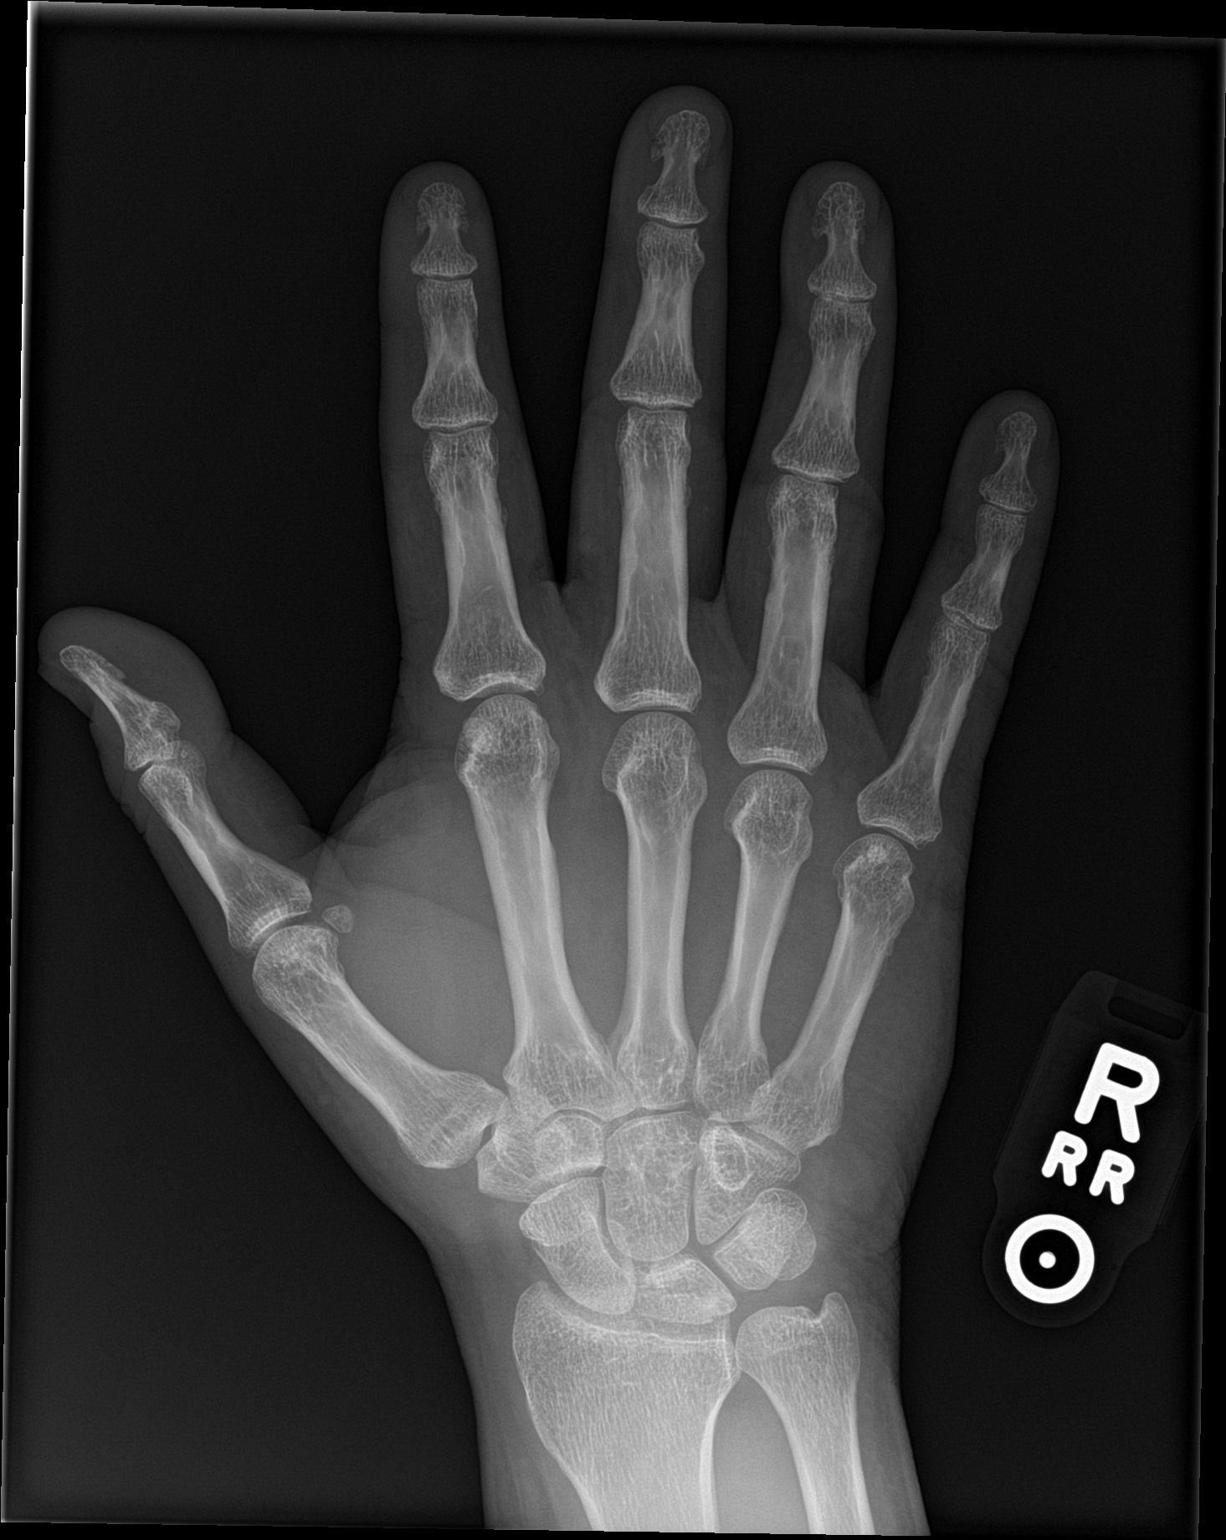

[hand obl]
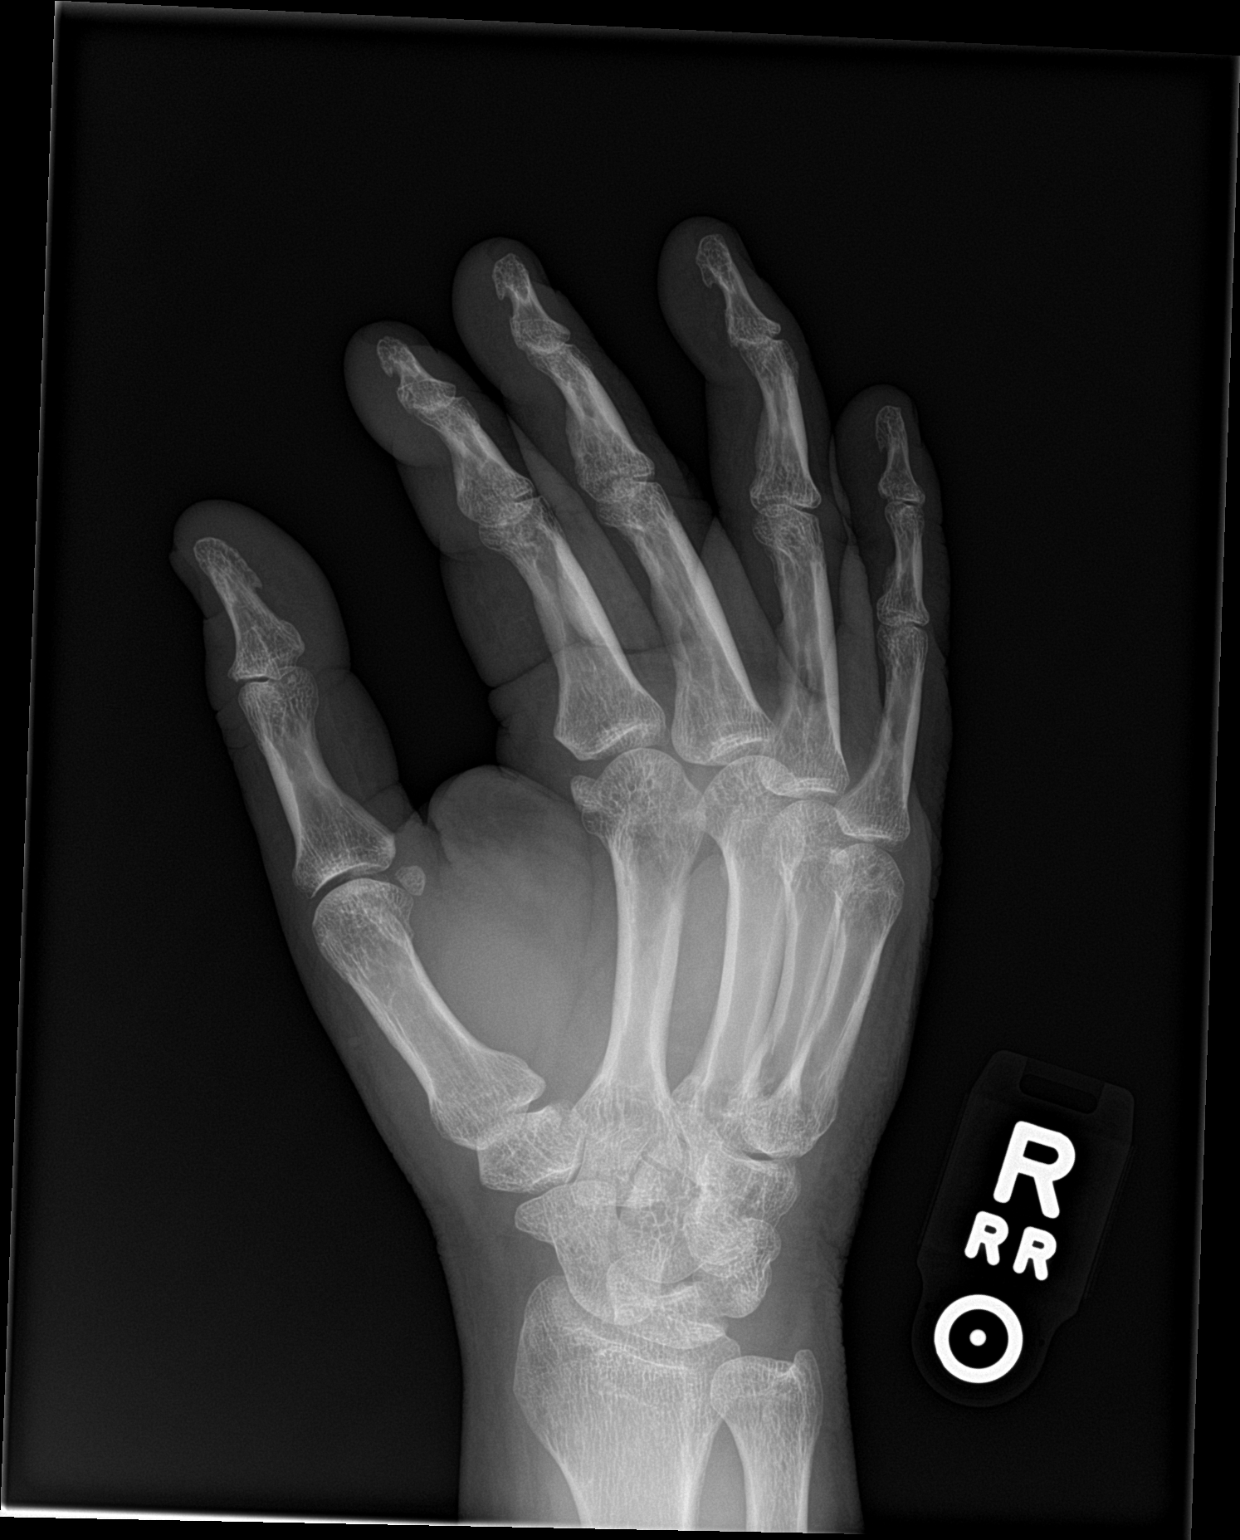

[hand lat]
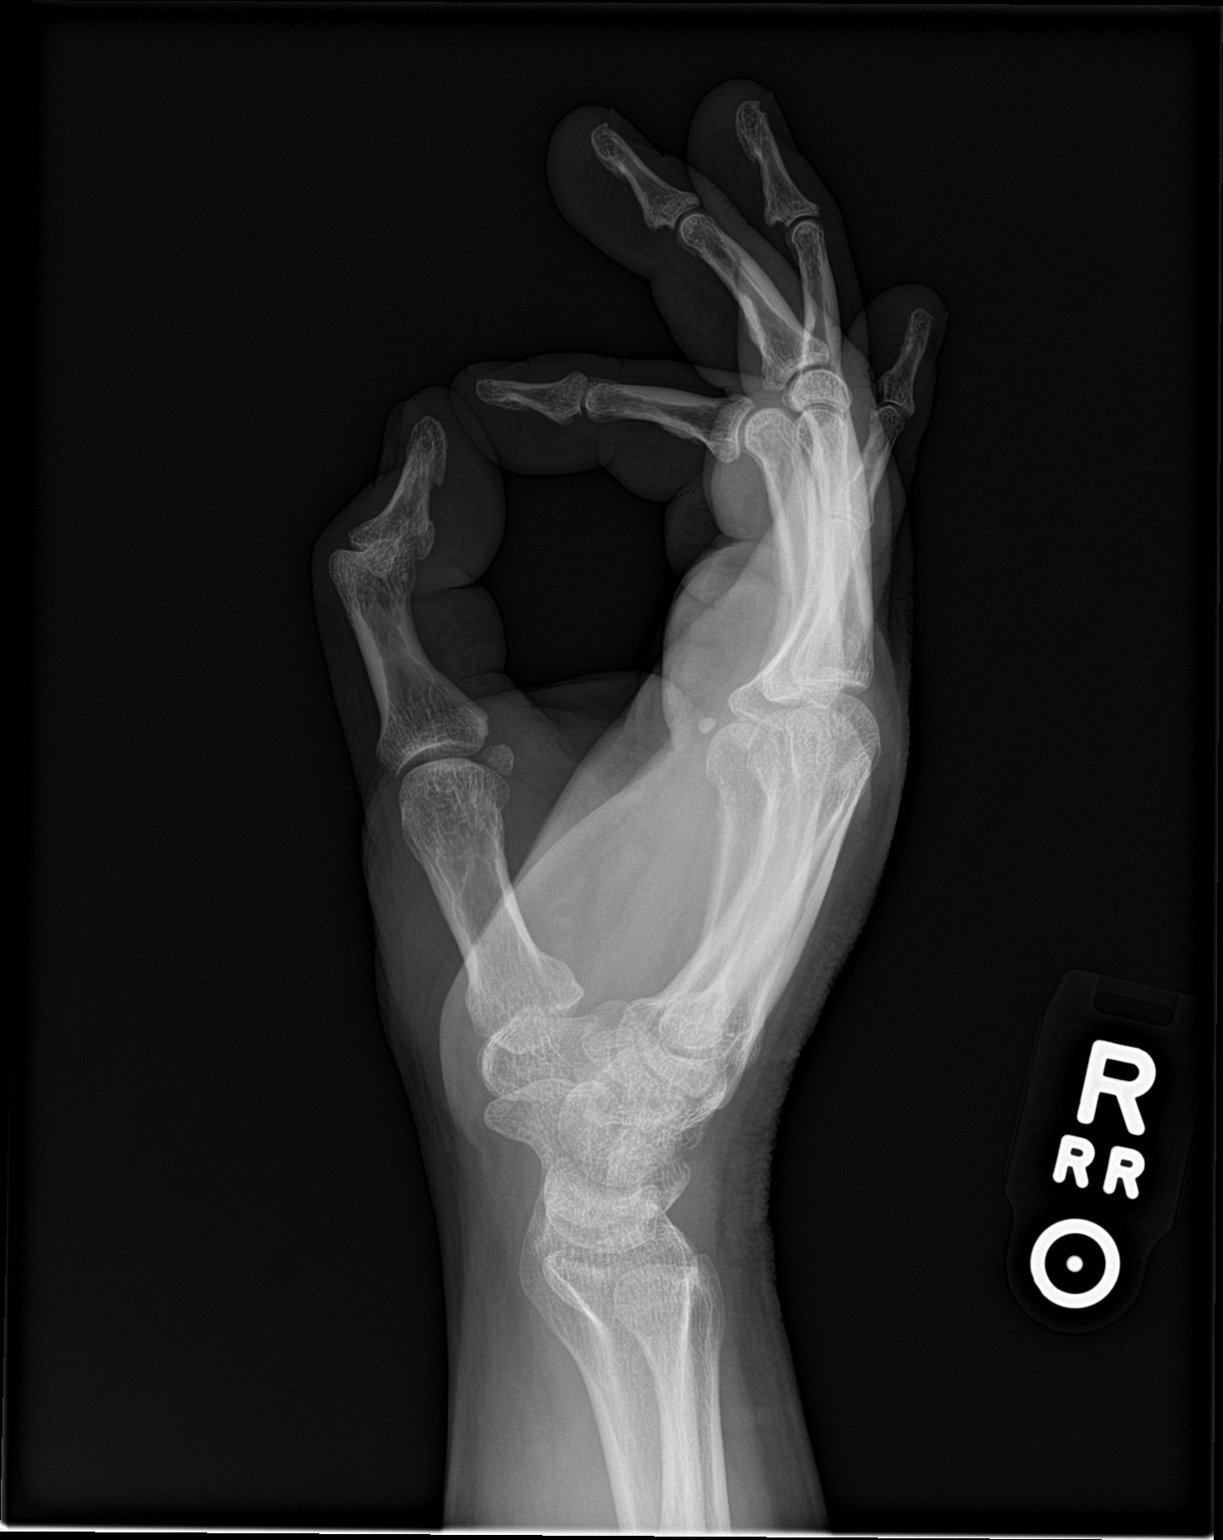

[3 of 3 positions shown; findings below may reference images not displayed]

FINDINGS: Normal visualized carpal bones. Normal first metacarpus. Normal
second through fifth metacarpi. Normal phalanges. There is no
demonstrated fracture.
Normal carpal articulations.
Normal carpometacarpal (CMC) articulation of the right thumb. Normal
metacarpophalangeal (MCP) joint of the right thumb. Normal
interphalangeal (IP) joint of the right thumb.
Normal second through fifth carpometacarpal (CMC) joints. Normal
second through fifth metacarpophalangeal (MCP) joints. Normal
proximal interphalangeal (PIP) and distal interphalangeal (DIP)
joints of the second through fifth fingers.
IMPRESSION: Normal right hand plain films.

## 2021-07-20 DIAGNOSIS — L400 Psoriasis vulgaris: Secondary | ICD-10-CM | POA: Diagnosis not present

## 2021-07-20 DIAGNOSIS — Z111 Encounter for screening for respiratory tuberculosis: Secondary | ICD-10-CM | POA: Diagnosis not present

## 2021-07-28 ENCOUNTER — Encounter: Payer: BC Managed Care – PPO | Admitting: Family Medicine

## 2021-08-08 DIAGNOSIS — R7612 Nonspecific reaction to cell mediated immunity measurement of gamma interferon antigen response without active tuberculosis: Secondary | ICD-10-CM | POA: Diagnosis not present

## 2021-08-08 DIAGNOSIS — R7611 Nonspecific reaction to tuberculin skin test without active tuberculosis: Secondary | ICD-10-CM | POA: Diagnosis not present

## 2021-08-09 ENCOUNTER — Telehealth: Payer: Self-pay | Admitting: Family Medicine

## 2021-08-09 NOTE — Telephone Encounter (Signed)
Medication: phentermine 37.5 MG capsule   Has the patient contacted their pharmacy? Yes.   (If no, request that the patient contact the pharmacy for the refill.) (If yes, when and what did the pharmacy advise?)  Preferred Pharmacy (with phone number or street name):  [facility] [address]  [place], [place] [address]  Phone:  [phone]  Fax:  [phone]  Agent: Please be advised that RX refills may take up to 3 business days. We ask that you follow-up with your pharmacy.

## 2021-08-09 NOTE — Telephone Encounter (Signed)
Sent refill denial to pharmacy by fax. Per PCP will discuss this medication at his next appt on 08/15/21

## 2021-08-15 ENCOUNTER — Ambulatory Visit (INDEPENDENT_AMBULATORY_CARE_PROVIDER_SITE_OTHER): Payer: BC Managed Care – PPO | Admitting: Family Medicine

## 2021-08-15 ENCOUNTER — Encounter: Payer: Self-pay | Admitting: Family Medicine

## 2021-08-15 VITALS — BP 132/88 | HR 85 | Temp 98.4°F | Ht 67.0 in | Wt 275.0 lb

## 2021-08-15 DIAGNOSIS — Z1211 Encounter for screening for malignant neoplasm of colon: Secondary | ICD-10-CM

## 2021-08-15 DIAGNOSIS — Z0000 Encounter for general adult medical examination without abnormal findings: Secondary | ICD-10-CM

## 2021-08-15 DIAGNOSIS — Z0001 Encounter for general adult medical examination with abnormal findings: Secondary | ICD-10-CM | POA: Diagnosis not present

## 2021-08-15 DIAGNOSIS — R062 Wheezing: Secondary | ICD-10-CM | POA: Diagnosis not present

## 2021-08-15 DIAGNOSIS — J4520 Mild intermittent asthma, uncomplicated: Secondary | ICD-10-CM | POA: Diagnosis not present

## 2021-08-15 LAB — COMPREHENSIVE METABOLIC PANEL
ALT: 29 U/L (ref 0–53)
AST: 25 U/L (ref 0–37)
Albumin: 4.3 g/dL (ref 3.5–5.2)
Alkaline Phosphatase: 97 U/L (ref 39–117)
BUN: 11 mg/dL (ref 6–23)
CO2: 29 mEq/L (ref 19–32)
Calcium: 9 mg/dL (ref 8.4–10.5)
Chloride: 100 mEq/L (ref 96–112)
Creatinine, Ser: 0.83 mg/dL (ref 0.40–1.50)
GFR: 103.58 mL/min (ref >60.00)
Glucose, Bld: 86 mg/dL (ref 70–99)
Potassium: 3.9 mEq/L (ref 3.5–5.1)
Sodium: 137 mEq/L (ref 135–145)
Total Bilirubin: 0.7 mg/dL (ref 0.2–1.2)
Total Protein: 7.5 g/dL (ref 6.0–8.3)

## 2021-08-15 LAB — CBC
HCT: 46.7 % (ref 39.0–52.0)
Hemoglobin: 15.3 g/dL (ref 13.0–17.0)
MCHC: 32.7 g/dL (ref 30.0–36.0)
MCV: 95 fl (ref 78.0–100.0)
Platelets: 257 10*3/uL (ref 150.0–400.0)
RBC: 4.92 Mil/uL (ref 4.22–5.81)
RDW: 15.7 % — ABNORMAL HIGH (ref 11.5–15.5)
WBC: 9.8 10*3/uL (ref 4.0–10.5)

## 2021-08-15 LAB — LIPID PANEL
Cholesterol: 155 mg/dL (ref 0–200)
HDL: 44.8 mg/dL (ref >39.00)
LDL Cholesterol: 85 mg/dL (ref 0–99)
NonHDL: 110.51
Total CHOL/HDL Ratio: 3
Triglycerides: 130 mg/dL (ref 0.0–149.0)
VLDL: 26 mg/dL (ref 0.0–40.0)

## 2021-08-15 MED ORDER — ALBUTEROL SULFATE HFA 108 (90 BASE) MCG/ACT IN AERS: 2.0000 | INHALATION_SPRAY | Freq: Four times a day (QID) | RESPIRATORY_TRACT | 0 refills | Status: DC | PRN

## 2021-08-15 MED ORDER — FLUTICASONE PROPIONATE HFA 110 MCG/ACT IN AERO: 2.0000 | INHALATION_SPRAY | Freq: Two times a day (BID) | RESPIRATORY_TRACT | 1 refills | Status: DC

## 2021-08-15 NOTE — Progress Notes (Signed)
Chief Complaint  Patient presents with   Follow-up    Well Male Michael Solomon is here for a complete physical.   His last physical was >1 year ago.  Current diet: in general, a "healthy" diet.   Current exercise: active at work Weight trend: down a few lbs since last visit Fatigue out of ordinary? No. Seat belt? Yes.   Advanced directive? No  Health maintenance Tetanus- Yes HIV- Yes Hep C- Yes  +intermittent wheezing/sob. Was sent in albuterol which did help. He has had steroids for psoriasis in the past which also helps his breathing. He will smoke intermittently. No coughing or fevers.   Past Medical History:  Diagnosis Date   Anxiety    Arthritis    Arthritis    Cataract    Chronic kidney disease    Depression    Hypertension    OSA (obstructive sleep apnea) 06/24/2017   Plaque psoriasis      Past Surgical History:  Procedure Laterality Date   EYE SURGERY Bilateral     Medications  Current Outpatient Medications on File Prior to Visit  Medication Sig Dispense Refill   albuterol (VENTOLIN HFA) 108 (90 Base) MCG/ACT inhaler Inhale 2 puffs into the lungs every 6 (six) hours as needed for wheezing or shortness of breath. 8 g 0   ammonium lactate (LAC-HYDRIN) 12 % lotion APPLY TO THE AFFECTED AREAS OF SKIN BID PRN  2   busPIRone (BUSPAR) 10 MG tablet Take 1 tablet (10 mg total) by mouth 2 (two) times daily. 180 tablet 2   clobetasol (OLUX) 0.05 % topical foam Apply topically 2 (two) times daily. 50 g 1   clobetasol cream (TEMOVATE) 6.56 % Apply 1 application topically 2 (two) times daily. 60 g 1   clonazePAM (KLONOPIN) 0.5 MG tablet TAKE 1 TABLET(0.5 MG) BY MOUTH TWICE DAILY AS NEEDED FOR ANXIETY 60 tablet 5   lisinopril (ZESTRIL) 20 MG tablet Take 1 tablet (20 mg total) by mouth daily. 30 tablet 3   meloxicam (MOBIC) 15 MG tablet TAKE 1 TABLET(15 MG) BY MOUTH DAILY 90 tablet 0   PARoxetine (PAXIL) 40 MG tablet Take 1 tablet (40 mg total) by mouth every morning. 90  tablet 2   potassium chloride (KLOR-CON M10) 10 MEQ tablet Take 1 tablet (10 mEq total) by mouth 2 (two) times daily. 30 tablet 0   Spacer/Aero-Hold Chamber Mask MISC Use with albuterol. 2 each 2   Talazoparib Tosylate (TALZENNA PO) Take by mouth.     TALTZ 80 MG/ML SOAJ      traMADol (ULTRAM) 50 MG tablet Take 1 tablet (50 mg total) by mouth every 6 (six) hours as needed for moderate pain. 30 tablet 2    Allergies No Known Allergies  Family History Family History  Problem Relation Age of Onset   Cancer Mother    Dementia Mother    Hypertension Mother    Thyroid disease Mother    Diabetes Father    Heart disease Father    Stroke Father    Hypertension Father    Diabetes Brother    Depression Brother     Review of Systems: Constitutional: no fevers or chills Eye:  no recent significant change in vision Ear/Nose/Mouth/Throat:  Ears:  no hearing loss Nose/Mouth/Throat:  no complaints of nasal congestion, no sore throat Cardiovascular:  no chest pain Respiratory: As noted in HPI Gastrointestinal:  no abdominal pain, no change in bowel habits GU:  Male: negative for dysuria, frequency, and incontinence Musculoskeletal/Extremities:  no new pain of the joints Integumentary (Skin/Breast):  no new abnormal skin lesions reported Neurologic:  no headaches Endocrine: No unexpected weight changes Hematologic/Lymphatic:  no night sweats  Exam BP 132/88    Pulse 85    Temp 98.4 F (36.9 C) (Oral)    Ht 5' 7"  (1.702 m)    Wt 275 lb (124.7 kg)    SpO2 98%    BMI 43.07 kg/m  General:  well developed, well nourished, in no apparent distress Skin:  no significant moles, warts, or growths Head:  no masses, lesions, or tenderness Eyes:  pupils equal and round, sclera anicteric without injection Ears:  canals without lesions, TMs shiny without retraction, no obvious effusion, no erythema Nose:  nares patent, septum midline, mucosa normal Throat/Pharynx:  lips and gingiva without lesion;  tongue and uvula midline; non-inflamed pharynx; no exudates or postnasal drainage Neck: neck supple without adenopathy, thyromegaly, or masses Lungs:  +exp wheezes, breath sounds equal bilaterally, no respiratory distress Cardio:  regular rate and rhythm, no bruits, no LE edema Abdomen:  abdomen soft, nontender; bowel sounds normal; no masses or organomegaly Rectal: Deferred Musculoskeletal:  symmetrical muscle groups noted without atrophy or deformity Extremities:  no clubbing, cyanosis, or edema, no deformities, no skin discoloration Neuro:  gait normal; deep tendon reflexes normal and symmetric Psych: well oriented with normal range of affect and appropriate judgment/insight  Assessment and Plan  Well adult exam - Plan: CBC, Comprehensive metabolic panel, Lipid panel  Screen for colon cancer  Morbid obesity ([place]), Chronic  Wheezing  Mild intermittent asthma without complication - Plan: fluticasone (FLOVENT HFA) 110 MCG/ACT inhaler   Well 51 y.o. male. Counseled on diet and exercise. Other orders as above. Asthma: Chronic, uncontrolled. Likely has asthma opposed to VC dysfunction given improvement w albuterol and steroids. ICS bid, rinse mouth out after use. Cont SABA prn. F/u in 1 mo to reck.  The patient voiced understanding and agreement to the plan.  [place], DO 08/15/21 10:54 AM

## 2021-08-15 NOTE — Patient Instructions (Addendum)
Give [place] 2-3 business days to get the results of your labs back.   Someone will reach out regarding your Cologard kit. They should send you something in the mail.   Keep the diet clean and stay active.  Let us know if you need anything.

## 2021-08-31 DIAGNOSIS — Z1211 Encounter for screening for malignant neoplasm of colon: Secondary | ICD-10-CM | POA: Diagnosis not present

## 2021-08-31 LAB — COLOGUARD: Cologuard: NEGATIVE

## 2021-09-08 ENCOUNTER — Other Ambulatory Visit: Payer: Self-pay

## 2021-09-08 DIAGNOSIS — Z1211 Encounter for screening for malignant neoplasm of colon: Secondary | ICD-10-CM

## 2021-09-08 LAB — COLOGUARD: COLOGUARD: NEGATIVE

## 2021-09-08 NOTE — Addendum Note (Signed)
Addended by: Gerilyn Nestle on: 09/08/2021 07:47 AM   Modules accepted: Orders

## 2021-09-11 ENCOUNTER — Other Ambulatory Visit: Payer: Self-pay | Admitting: Family Medicine

## 2021-09-11 MED ORDER — MELOXICAM 15 MG PO TABS: ORAL_TABLET | ORAL | 0 refills | Status: DC

## 2021-09-19 ENCOUNTER — Ambulatory Visit: Payer: BC Managed Care – PPO | Admitting: Family Medicine

## 2021-09-28 ENCOUNTER — Ambulatory Visit (INDEPENDENT_AMBULATORY_CARE_PROVIDER_SITE_OTHER): Payer: BC Managed Care – PPO | Admitting: Family Medicine

## 2021-09-28 ENCOUNTER — Encounter: Payer: Self-pay | Admitting: Family Medicine

## 2021-09-28 VITALS — BP 120/84 | HR 97 | Temp 98.9°F | Ht 65.0 in | Wt 272.0 lb

## 2021-09-28 DIAGNOSIS — J4530 Mild persistent asthma, uncomplicated: Secondary | ICD-10-CM | POA: Diagnosis not present

## 2021-09-28 HISTORY — DX: Mild persistent asthma, uncomplicated: J45.30

## 2021-09-28 MED ORDER — METHYLPREDNISOLONE ACETATE 80 MG/ML IJ SUSP
80.0000 mg | Freq: Once | INTRAMUSCULAR | Status: AC
  Administered 2021-09-28: 80 mg via INTRAMUSCULAR

## 2021-09-28 MED ORDER — PULMICORT FLEXHALER 90 MCG/ACT IN AEPB: INHALATION_SPRAY | RESPIRATORY_TRACT | 2 refills | Status: DC

## 2021-09-28 NOTE — Addendum Note (Signed)
Addended by: Sharon Seller B on: 09/28/2021 01:06 PM   Modules accepted: Orders

## 2021-09-28 NOTE — Progress Notes (Signed)
Chief Complaint  Patient presents with   Follow-up    1 month   Cough    For 2 weeks Headache     Michael Solomon is a 51 y.o. male here for COPD.  Currently treated with SABA as the ICS was not in stock at pharmacy. Reports breathing is good when he uses inhaler. Still coughing and wheezing. Has HA due to coughing so much.   Past Medical History:  Diagnosis Date   Anxiety    Arthritis    Arthritis    Cataract    Chronic kidney disease    Depression    Hypertension    Mild persistent asthma without complication 5/39/7673   OSA (obstructive sleep apnea) 06/24/2017   Plaque psoriasis     BP 120/84    Pulse 97    Temp 98.9 F (37.2 C) (Oral)    Ht 5' 5"  (1.651 m)    Wt 272 lb (123.4 kg)    SpO2 95%    BMI 45.26 kg/m  Gen: Awake, alert HEENT: MMM, post nasal drainage appreciated, nares patent, no polyps Heart: RRR Lungs: faint exp wheezes at bases, no accessory muscle use Psych: Age appropriate judgment and insight  Mild persistent asthma without complication - Plan: Budesonide (PULMICORT FLEXHALER) 90 MCG/ACT inhaler  Chronic, not controlled. Depo injection today given wheezing and HA. Cont SABA prn. Use Pulmicort, hopefully that will be in stock. F/u in 6 weeks. The patient voiced understanding and agreement to the plan.  [place], DO 09/28/21 12:54 PM

## 2021-09-28 NOTE — Patient Instructions (Addendum)
Rinse your mouth out after using the new inhaler (Pulmicort).   Continue the albuterol as needed.  Let me know if there are issues getting your inhaler.   Let us know if you need anything.

## 2021-10-03 ENCOUNTER — Encounter: Payer: Self-pay | Admitting: Family Medicine

## 2021-10-11 ENCOUNTER — Encounter: Payer: Self-pay | Admitting: Family Medicine

## 2021-10-11 ENCOUNTER — Telehealth: Payer: Self-pay | Admitting: Family Medicine

## 2021-10-11 ENCOUNTER — Ambulatory Visit: Payer: BC Managed Care – PPO | Admitting: Family Medicine

## 2021-10-11 VITALS — BP 131/90 | HR 99 | Temp 98.8°F | Ht 67.0 in | Wt 264.4 lb

## 2021-10-11 DIAGNOSIS — J4530 Mild persistent asthma, uncomplicated: Secondary | ICD-10-CM

## 2021-10-11 DIAGNOSIS — M722 Plantar fascial fibromatosis: Secondary | ICD-10-CM

## 2021-10-11 DIAGNOSIS — J40 Bronchitis, not specified as acute or chronic: Secondary | ICD-10-CM

## 2021-10-11 LAB — POCT INFLUENZA A/B
Influenza A, POC: NEGATIVE
Influenza B, POC: NEGATIVE

## 2021-10-11 LAB — POC COVID19 BINAXNOW: SARS Coronavirus 2 Ag: NEGATIVE

## 2021-10-11 LAB — POCT RAPID STREP A (OFFICE): Rapid Strep A Screen: NEGATIVE

## 2021-10-11 MED ORDER — AZITHROMYCIN 250 MG PO TABS: ORAL_TABLET | ORAL | 0 refills | Status: DC

## 2021-10-11 MED ORDER — METHYLPREDNISOLONE ACETATE 80 MG/ML IJ SUSP
80.0000 mg | Freq: Once | INTRAMUSCULAR | Status: AC
  Administered 2021-10-11: 80 mg via INTRAMUSCULAR

## 2021-10-11 MED ORDER — PREDNISONE 20 MG PO TABS: 40.0000 mg | ORAL_TABLET | Freq: Every day | ORAL | 0 refills | Status: DC

## 2021-10-11 MED ORDER — TRAMADOL HCL 50 MG PO TABS: 50.0000 mg | ORAL_TABLET | Freq: Four times a day (QID) | ORAL | 2 refills | Status: DC | PRN

## 2021-10-11 MED ORDER — PULMICORT FLEXHALER 90 MCG/ACT IN AEPB: INHALATION_SPRAY | RESPIRATORY_TRACT | 2 refills | Status: DC

## 2021-10-11 NOTE — Patient Instructions (Signed)
Continue to push fluids, practice good hand hygiene, and cover your mouth if you cough. ? ?If you start having fevers, shaking or shortness of breath, seek immediate care. ? ?OK to take Tylenol 1000 mg (2 extra strength tabs) or 975 mg (3 regular strength tabs) every 6 hours as needed. ? ?Let us know if you need anything. ?

## 2021-10-11 NOTE — Progress Notes (Signed)
Chief Complaint  ?Patient presents with  ? Cough  ?  Headache ?Sore throat ?Sick since Monday  ? ? ?Michael Solomon here for URI complaints. ? ?Duration: 2 days  ?Associated symptoms: Fever (101.6 F), sinus headache, sinus pain, rhinorrhea, sore throat, wheezing, shortness of breath, myalgia, and coughing ?Denies: sinus congestion, itchy watery eyes, ear pain, ear drainage, and N/V/D ?Treatment to date: Dayquil, Nyquil, tramadol ?Sick contacts: Yes; GF's daughter ? ?Past Medical History:  ?Diagnosis Date  ? Anxiety   ? Arthritis   ? Arthritis   ? Cataract   ? Chronic kidney disease   ? Depression   ? Hypertension   ? Mild persistent asthma without complication 2/68/3419  ? OSA (obstructive sleep apnea) 06/24/2017  ? Plaque psoriasis   ? ? ?Objective ?BP 131/90   Pulse 99   Temp 98.8 ?F (37.1 ?C) (Oral)   Ht 5' 7"  (1.702 m)   Wt 264 lb 6 oz (119.9 kg)   SpO2 97%   BMI 41.41 kg/m?  ?General: Awake, alert, appears stated age ?HEENT: AT, [place], ears patent b/l and TM's neg, nares patent w/o discharge, pharynx pink and without exudates, MMM ?Neck: No masses or asymmetry ?Heart: RRR ?Lungs: exp wheezes heard at bases, no accessory muscle use ?Psych: Age appropriate judgment and insight, normal mood and affect ? ?Wheezy bronchitis - Plan: azithromycin (ZITHROMAX) 250 MG tablet, methylPREDNISolone acetate (DEPO-MEDROL) injection 80 mg, POCT rapid strep A, POCT Influenza A/B, POC COVID-19 ? ?Mild persistent asthma without complication - Plan: Budesonide (PULMICORT FLEXHALER) 90 MCG/ACT inhaler, methylPREDNISolone acetate (DEPO-MEDROL) injection 80 mg, POCT rapid strep A, POCT Influenza A/B, POC COVID-19 ? ?Depomedrol today.  Z-Pak starting today given the reports of fevers of over 100.4 ?F. ?Flu, covid and strep test neg.  ?Continue to push fluids, practice good hand hygiene, cover mouth when coughing. ?F/u prn. If starting to experience worsening symptoms, shaking, or shortness of breath, seek immediate care. ?Pt voiced  understanding and agreement to the plan. ? ?Shelda Pal, DO ?10/11/21 ?4:29 PM ? ?

## 2021-10-11 NOTE — Telephone Encounter (Signed)
Refill request for Tramadol ?Last RF---04/26/21----#30 with 2 refills ?Last OV--09/28/21 ?

## 2021-10-15 DIAGNOSIS — J209 Acute bronchitis, unspecified: Secondary | ICD-10-CM | POA: Diagnosis not present

## 2021-10-15 DIAGNOSIS — Z20822 Contact with and (suspected) exposure to covid-19: Secondary | ICD-10-CM | POA: Diagnosis not present

## 2021-10-15 DIAGNOSIS — R059 Cough, unspecified: Secondary | ICD-10-CM | POA: Diagnosis not present

## 2021-10-15 DIAGNOSIS — B999 Unspecified infectious disease: Secondary | ICD-10-CM | POA: Diagnosis not present

## 2021-10-15 DIAGNOSIS — H1089 Other conjunctivitis: Secondary | ICD-10-CM | POA: Diagnosis not present

## 2021-10-15 DIAGNOSIS — J329 Chronic sinusitis, unspecified: Secondary | ICD-10-CM | POA: Diagnosis not present

## 2021-10-15 DIAGNOSIS — R509 Fever, unspecified: Secondary | ICD-10-CM | POA: Diagnosis not present

## 2021-10-19 ENCOUNTER — Ambulatory Visit: Payer: BC Managed Care – PPO | Admitting: Family Medicine

## 2021-10-19 ENCOUNTER — Encounter: Payer: Self-pay | Admitting: Family Medicine

## 2021-10-19 VITALS — BP 128/92 | HR 98 | Temp 98.2°F | Ht 67.0 in | Wt 276.0 lb

## 2021-10-19 DIAGNOSIS — J40 Bronchitis, not specified as acute or chronic: Secondary | ICD-10-CM | POA: Diagnosis not present

## 2021-10-19 DIAGNOSIS — J329 Chronic sinusitis, unspecified: Secondary | ICD-10-CM

## 2021-10-19 MED ORDER — BENZONATATE 200 MG PO CAPS: 200.0000 mg | ORAL_CAPSULE | Freq: Two times a day (BID) | ORAL | 0 refills | Status: DC | PRN

## 2021-10-19 MED ORDER — GUAIFENESIN ER 600 MG PO TB12: 1200.0000 mg | ORAL_TABLET | Freq: Two times a day (BID) | ORAL | 2 refills | Status: DC

## 2021-10-19 MED ORDER — AMOXICILLIN-POT CLAVULANATE 875-125 MG PO TABS: 1.0000 | ORAL_TABLET | Freq: Two times a day (BID) | ORAL | 0 refills | Status: AC

## 2021-10-19 NOTE — Progress Notes (Signed)
? ?Acute Office Visit ? ?Subjective:  ? ? Patient ID: Michael Solomon, male    DOB: 05-19-1971, 51 y.o.   MRN: [id number] ? ?CC: cough/congestion ? ? ?HPI ?Patient is in today for cough/congestion ? ?Patient most recently saw PCP on 10/11/21 for bronchitis/asthma. Negative for COVID, Flu and Strep. He was given depomedrol injection and zpak. On 09/28/21 his asthma was treated with SABA and ICS. ? ?Patient is here today to discuss cough. He was recently seen at [facility] ER for ongoing cough and pink eye. CXR negative. Dx with bronchitis and pink eye - they wrote him out of work for 5 days; prescribed eye drops, steroids and another Zpak. HR department at work would not accept the note, stated it needed to be from primary care office. He needs a new note.  ?Has now had 2 rounds of steroids, and 2 Zpaks. Pink eye has resolved, but cough/congestion is still significant with green sputum, nasal congestion, occasional dyspnea and wheezing.  ?He has not had any recent fevers, n/v/d, rashes, chest pain.  ? ? ? ? ?Past Medical History:  ?Diagnosis Date  ? Anxiety   ? Arthritis   ? Arthritis   ? Cataract   ? Chronic kidney disease   ? Depression   ? Hypertension   ? Mild persistent asthma without complication 08/20/7260  ? OSA (obstructive sleep apnea) 06/24/2017  ? Plaque psoriasis   ? ? ?Past Surgical History:  ?Procedure Laterality Date  ? EYE SURGERY Bilateral   ? ? ?Family History  ?Problem Relation Age of Onset  ? Cancer Mother   ? Dementia Mother   ? Hypertension Mother   ? Thyroid disease Mother   ? Diabetes Father   ? Heart disease Father   ? Stroke Father   ? Hypertension Father   ? Diabetes Brother   ? Depression Brother   ? ? ?Social History  ? ?Socioeconomic History  ? Marital status: Married  ?  Spouse name: Not on file  ? Number of children: Not on file  ? Years of education: Not on file  ? Highest education level: Not on file  ?Occupational History  ? Not on file  ?Tobacco Use  ? Smoking status: Light Smoker  ?   Packs/day: 0.10  ?  Years: 25.00  ?  Pack years: 2.50  ?  Types: Cigarettes  ? Smokeless tobacco: Never  ?Vaping Use  ? Vaping Use: Never used  ?Substance and Sexual Activity  ? Alcohol use: No  ? Drug use: No  ? Sexual activity: Not on file  ?Other Topics Concern  ? Not on file  ?Social History Narrative  ? Not on file  ? ?Social Determinants of Health  ? ?Financial Resource Strain: Not on file  ?Food Insecurity: Not on file  ?Transportation Needs: Not on file  ?Physical Activity: Not on file  ?Stress: Not on file  ?Social Connections: Not on file  ?Intimate Partner Violence: Not on file  ? ? ?Outpatient Medications Prior to Visit  ?Medication Sig Dispense Refill  ? Certolizumab Pegol (CIMZIA [place]) Inject into the skin.    ? ammonium lactate (LAC-HYDRIN) 12 % lotion APPLY TO THE AFFECTED AREAS OF SKIN BID PRN  2  ? azithromycin (ZITHROMAX) 250 MG tablet Take 2 tabs the first day and then 1 tab daily until you run out. 6 tablet 0  ? Budesonide (PULMICORT FLEXHALER) 90 MCG/ACT inhaler Take 2 puffs daily. 1 each 2  ? busPIRone (BUSPAR) 10 MG tablet  Take 1 tablet (10 mg total) by mouth 2 (two) times daily. 180 tablet 2  ? clobetasol (OLUX) 0.05 % topical foam Apply topically 2 (two) times daily. 50 g 1  ? clobetasol cream (TEMOVATE) 7.67 % Apply 1 application topically 2 (two) times daily. 60 g 1  ? clonazePAM (KLONOPIN) 0.5 MG tablet TAKE 1 TABLET(0.5 MG) BY MOUTH TWICE DAILY AS NEEDED FOR ANXIETY 60 tablet 5  ? lisinopril (ZESTRIL) 20 MG tablet Take 1 tablet (20 mg total) by mouth daily. 30 tablet 3  ? meloxicam (MOBIC) 15 MG tablet TAKE 1 TABLET(15 MG) BY MOUTH DAILY 90 tablet 0  ? PARoxetine (PAXIL) 40 MG tablet Take 1 tablet (40 mg total) by mouth every morning. 90 tablet 2  ? SKYRIZI PEN 150 MG/ML SOAJ Inject into the skin.    ? Spacer/Aero-Hold Chamber Mask MISC Use with albuterol. 2 each 2  ? Talazoparib Tosylate (TALZENNA PO) Take by mouth.    ? traMADol (ULTRAM) 50 MG tablet Take 1 tablet (50 mg total) by mouth  every 6 (six) hours as needed for moderate pain. 30 tablet 2  ? albuterol (VENTOLIN HFA) 108 (90 Base) MCG/ACT inhaler Inhale 2 puffs into the lungs every 6 (six) hours as needed for wheezing or shortness of breath. 8 g 0  ? potassium chloride (KLOR-CON M10) 10 MEQ tablet Take 1 tablet (10 mEq total) by mouth 2 (two) times daily. 30 tablet 0  ? TALTZ 80 MG/ML SOAJ     ? ?No facility-administered medications prior to visit.  ? ? ?No Known Allergies ? ?Review of Systems ?All review of systems negative except what is listed in the HPI ? ?   ?Objective:  ?  ?Physical Exam ?Constitutional:   ?   Appearance: Normal appearance.  ?HENT:  ?   Head: Normocephalic and atraumatic.  ?Eyes:  ?   Conjunctiva/sclera: Conjunctivae normal.  ?Cardiovascular:  ?   Rate and Rhythm: Normal rate and regular rhythm.  ?Pulmonary:  ?   Effort: Pulmonary effort is normal. No respiratory distress.  ?   Breath sounds: Normal breath sounds. No wheezing, rhonchi or rales.  ?Skin: ?   General: Skin is warm and dry.  ?   Capillary Refill: Capillary refill takes less than 2 seconds.  ?Neurological:  ?   General: No focal deficit present.  ?   Mental Status: He is alert and oriented to person, place, and time. Mental status is at baseline.  ?Psychiatric:     ?   Mood and Affect: Mood normal.     ?   Behavior: Behavior normal.     ?   Thought Content: Thought content normal.     ?   Judgment: Judgment normal.  ? ? ?BP (!) 128/92   Pulse 98   Temp 98.2 ?F (36.8 ?C)   Ht 5' 7"  (1.702 m)   Wt 276 lb (125.2 kg)   SpO2 95%   BMI 43.23 kg/m?  ?Wt Readings from Last 3 Encounters:  ?10/19/21 276 lb (125.2 kg)  ?10/11/21 264 lb 6 oz (119.9 kg)  ?09/28/21 272 lb (123.4 kg)  ? ? ?There are no preventive care reminders to display for this patient. ? ?There are no preventive care reminders to display for this patient. ? ? ?Lab Results  ?Component Value Date  ? TSH 1.15 05/16/2018  ? ?Lab Results  ?Component Value Date  ? WBC 9.8 08/15/2021  ? HGB 15.3  08/15/2021  ? HCT 46.7 08/15/2021  ?  MCV 95.0 08/15/2021  ? PLT 257.0 08/15/2021  ? ?Lab Results  ?Component Value Date  ? NA 137 08/15/2021  ? K 3.9 08/15/2021  ? CO2 29 08/15/2021  ? GLUCOSE 86 08/15/2021  ? BUN 11 08/15/2021  ? CREATININE 0.83 08/15/2021  ? BILITOT 0.7 08/15/2021  ? ALKPHOS 97 08/15/2021  ? AST 25 08/15/2021  ? ALT 29 08/15/2021  ? PROT 7.5 08/15/2021  ? ALBUMIN 4.3 08/15/2021  ? CALCIUM 9.0 08/15/2021  ? GFR 103.58 08/15/2021  ? ?Lab Results  ?Component Value Date  ? CHOL 155 08/15/2021  ? ?Lab Results  ?Component Value Date  ? HDL 44.80 08/15/2021  ? ?Lab Results  ?Component Value Date  ? [place] 85 08/15/2021  ? ?Lab Results  ?Component Value Date  ? TRIG 130.0 08/15/2021  ? ?Lab Results  ?Component Value Date  ? CHOLHDL 3 08/15/2021  ? ?Lab Results  ?Component Value Date  ? HGBA1C 5.5 03/06/2019  ? ? ?   ?Assessment & Plan:  ? ?1. Sinobronchitis ?Changing to a different antibiotic (Augmentin) to see if this will help.  ?Continue Mucinex and Tessalon (refills sent in).  ?Continue supportive measures including rest, hydration, humidifier use, steam showers, warm compresses to sinuses, warm liquids with lemon and honey, and over-the-counter cough, cold, and analgesics as needed.  ?Finish out the steroids the ED gave you and start the new inhaler Dr. Nani Ravens sent in last time. ?Patient aware of signs/symptoms requiring further/urgent evaluation.  ? ?- amoxicillin-clavulanate (AUGMENTIN) 875-125 MG tablet; Take 1 tablet by mouth 2 (two) times daily for 7 days.  Dispense: 14 tablet; Refill: 0 ?- benzonatate (TESSALON) 200 MG capsule; Take 1 capsule (200 mg total) by mouth 2 (two) times daily as needed for cough.  Dispense: 20 capsule; Refill: 0 ?- guaiFENesin (MUCINEX) 600 MG 12 hr tablet; Take 2 tablets (1,200 mg total) by mouth 2 (two) times daily.  Dispense: 30 tablet; Refill: 2 ? ?Please contact office for follow-up if symptoms do not improve or worsen. Seek emergency care if symptoms become  severe. ? ? ?Terrilyn Saver, NP ? ?

## 2021-10-19 NOTE — Patient Instructions (Signed)
Changing to a different antibiotic to see if this will help.  ?Continue Mucinex and Tessalon (refills sent in).  ?Continue supportive measures including rest, hydration, humidifier use, steam showers, warm compresses to sinuses, warm liquids with lemon and honey, and over-the-counter cough, cold, and analgesics as needed.  ?Finish out the steroids the ED gave you and start the new inhaler Dr. Nani Ravens sent in last time.  ?

## 2021-10-27 ENCOUNTER — Ambulatory Visit: Payer: BC Managed Care – PPO | Admitting: Family Medicine

## 2021-10-30 ENCOUNTER — Ambulatory Visit: Payer: BC Managed Care – PPO | Admitting: Family Medicine

## 2021-11-07 ENCOUNTER — Ambulatory Visit: Payer: BC Managed Care – PPO | Admitting: Family Medicine

## 2021-11-07 ENCOUNTER — Encounter: Payer: Self-pay | Admitting: Family Medicine

## 2021-11-13 ENCOUNTER — Telehealth: Payer: Self-pay | Admitting: Family Medicine

## 2021-11-13 DIAGNOSIS — F419 Anxiety disorder, unspecified: Secondary | ICD-10-CM

## 2021-11-13 MED ORDER — CLONAZEPAM 0.5 MG PO TABS: ORAL_TABLET | ORAL | 5 refills | Status: DC

## 2021-11-13 NOTE — Telephone Encounter (Signed)
Refill request for Clonazepam ?Last RF--04/26/2021--#60 with 5 refills ?Last OV----10/11/2021 ? ?

## 2021-11-21 ENCOUNTER — Other Ambulatory Visit: Payer: Self-pay | Admitting: Family Medicine

## 2021-11-21 DIAGNOSIS — I10 Essential (primary) hypertension: Secondary | ICD-10-CM

## 2021-11-21 MED ORDER — LISINOPRIL 20 MG PO TABS: 20.0000 mg | ORAL_TABLET | Freq: Every day | ORAL | 3 refills | Status: DC

## 2021-11-28 ENCOUNTER — Ambulatory Visit: Payer: BC Managed Care – PPO | Admitting: Family Medicine

## 2021-11-28 ENCOUNTER — Encounter: Payer: Self-pay | Admitting: Family Medicine

## 2021-11-28 VITALS — BP 118/82 | HR 86 | Temp 98.0°F | Ht 67.0 in | Wt 268.1 lb

## 2021-11-28 DIAGNOSIS — F419 Anxiety disorder, unspecified: Secondary | ICD-10-CM

## 2021-11-28 DIAGNOSIS — J4530 Mild persistent asthma, uncomplicated: Secondary | ICD-10-CM | POA: Diagnosis not present

## 2021-11-28 DIAGNOSIS — Z79899 Other long term (current) drug therapy: Secondary | ICD-10-CM

## 2021-11-28 DIAGNOSIS — M722 Plantar fascial fibromatosis: Secondary | ICD-10-CM

## 2021-11-28 DIAGNOSIS — J302 Other seasonal allergic rhinitis: Secondary | ICD-10-CM

## 2021-11-28 MED ORDER — TRAMADOL HCL 50 MG PO TABS: 50.0000 mg | ORAL_TABLET | Freq: Four times a day (QID) | ORAL | 2 refills | Status: DC | PRN

## 2021-11-28 MED ORDER — FLUTICASONE PROPIONATE 50 MCG/ACT NA SUSP: 2.0000 | Freq: Every day | NASAL | 2 refills | Status: DC

## 2021-11-28 NOTE — Progress Notes (Signed)
Chief Complaint  ?Patient presents with  ? Follow-up  ? ? ?Subjective: ?Patient is a 51 y.o. male here for asthma f/u. ? ?Started Pulmicort 2 mo ago. Breathing well when not ill.  He is compliant with the medication, reports no adverse effects.  He remembers to rinse his mouth out after each use.  He is down to using his rescue inhaler twice per week.  No coughing or wheezing. ? ?Anxiety-history of anxiety on Paxil 40 mg daily and BuSpar 10 mg twice daily.  He is also on Klonopin 0.5-1 mg twice daily as needed.  His brother is on dialysis and his mother is in poor health as well.  He is a primary caregiver for both of them.  He is also working full-time which places quite a strain in his life and stress level.  He has no homicidal or suicidal ideation.  No self-medication.  He is not following with a counselor or psychologist at this time.  He does not wish to change any medications at this time. ? ?Past Medical History:  ?Diagnosis Date  ? Anxiety   ? Arthritis   ? Arthritis   ? Cataract   ? Chronic kidney disease   ? Depression   ? Hypertension   ? Mild persistent asthma without complication 7/68/0881  ? OSA (obstructive sleep apnea) 06/24/2017  ? Plaque psoriasis   ? ? ?Objective: ?BP 118/82   Pulse 86   Temp 98 ?F (36.7 ?C) (Oral)   Ht 5' 7"  (1.702 m)   Wt 268 lb 2 oz (121.6 kg)   SpO2 96%   BMI 41.99 kg/m?  ?General: Awake, appears stated age ?Heart: RRR, no LE edema ?Lungs: CTAB, no rales, wheezes or rhonchi. No accessory muscle use ?Psych: Age appropriate judgment and insight, normal affect and mood ? ?Assessment and Plan: ?Mild persistent asthma without complication ? ?Anxiety ? ?Seasonal allergies ? ?Plantar fasciitis ? ?Encounter for long-term (current) use of high-risk medication - Plan: Drug Monitoring Panel [phone] , Urine ? ?Chronic, stable.  Continue Pulmicort 90 mcg, 2 puffs daily.  Continue albuterol as needed. ?Chronic, stable.  Continue BuSpar 10 mg twice daily, Paxil 40 mg daily, Klonopin  0.5 mg twice daily as needed.  Controlled substance contract and UDS updated today.  Counseling information provided today. ?Continue oral antihistamine, add Flonase daily.  Over-the-counter options provided in paperwork. ?Follow-up in 6 months for medication check or as needed. ?The patient voiced understanding and agreement to the plan. ? ?Shelda Pal, DO ?11/28/21  ?1:12 PM ? ? ? ? ?

## 2021-11-28 NOTE — Patient Instructions (Addendum)
Claritin (loratadine), Allegra (fexofenadine), Zyrtec (cetirizine) which is also equivalent to Xyzal (levocetirizine); these are listed in order from weakest to strongest. Generic, and therefore cheaper, options are in the parentheses.  ? ?Flonase (fluticasone); nasal spray that is over the counter. 2 sprays each nostril, once daily. Aim towards the same side eye when you spray. ? ?There are available OTC, and the generic versions, which may be cheaper, are in parentheses. Show this to a pharmacist if you have trouble finding any of these items. ? ?If you start needing more albuterol during the week, let me know.  ? ?Keep the diet clean and stay active. ? ?Please consider counseling. Contact [phone] to schedule an appointment or inquire about cost/insurance coverage. ? ?[facility] located at [address], [place], [place], [place].  Phone number = [phone].  Dr. Lennice Sites - Adult Psychiatry.  ?  ?[facility] located at [address], [place], [place]. Phone number = [phone]. ?  ?[facility] located at [address] [place], [place].  Phone number = [phone]. ?  ?[facility] located at [address], [place], [place].  Phone number = [phone]. ? ?Let us know if you need anything. ?

## 2021-11-30 LAB — DRUG MONITORING PANEL 376104, URINE
Amphetamines: NEGATIVE ng/mL (ref <500)
Barbiturates: NEGATIVE ng/mL (ref <300)
Benzodiazepines: NEGATIVE ng/mL (ref <100)
Cocaine Metabolite: NEGATIVE ng/mL (ref <150)
Desmethyltramadol: NEGATIVE ng/mL (ref <100)
Opiates: NEGATIVE ng/mL (ref <100)
Oxycodone: NEGATIVE ng/mL (ref <100)
Tramadol: NEGATIVE ng/mL (ref <100)

## 2021-11-30 LAB — DM TEMPLATE

## 2022-02-20 ENCOUNTER — Other Ambulatory Visit: Payer: Self-pay | Admitting: Family Medicine

## 2022-02-20 MED ORDER — MELOXICAM 15 MG PO TABS: ORAL_TABLET | ORAL | 0 refills | Status: DC

## 2022-03-26 ENCOUNTER — Other Ambulatory Visit: Payer: Self-pay | Admitting: Family Medicine

## 2022-03-26 DIAGNOSIS — I10 Essential (primary) hypertension: Secondary | ICD-10-CM

## 2022-03-26 MED ORDER — LISINOPRIL 20 MG PO TABS: 20.0000 mg | ORAL_TABLET | Freq: Every day | ORAL | 3 refills | Status: DC

## 2022-04-13 ENCOUNTER — Other Ambulatory Visit: Payer: Self-pay | Admitting: Family Medicine

## 2022-04-13 MED ORDER — TRAMADOL HCL 50 MG PO TABS: 50.0000 mg | ORAL_TABLET | Freq: Four times a day (QID) | ORAL | 2 refills | Status: DC | PRN

## 2022-04-13 NOTE — Telephone Encounter (Signed)
[facility] Tramadol last RF--11/28/2021  #30 with 2 refills Last OV--11/28/2021. CSC/UDS--- updated on 11/28/2021.

## 2022-04-24 ENCOUNTER — Encounter: Payer: Self-pay | Admitting: Family Medicine

## 2022-04-24 ENCOUNTER — Ambulatory Visit: Payer: BC Managed Care – PPO | Admitting: Family Medicine

## 2022-04-24 VITALS — BP 120/80 | HR 86 | Temp 97.7°F | Resp 18 | Ht 67.0 in | Wt 281.6 lb

## 2022-04-24 DIAGNOSIS — J4540 Moderate persistent asthma, uncomplicated: Secondary | ICD-10-CM | POA: Diagnosis not present

## 2022-04-24 MED ORDER — FLUTICASONE-SALMETEROL 250-50 MCG/ACT IN AEPB: 1.0000 | INHALATION_SPRAY | Freq: Two times a day (BID) | RESPIRATORY_TRACT | 2 refills | Status: DC

## 2022-04-24 NOTE — Patient Instructions (Signed)
Rinse your mouth out after you use the inhaler still.   Continue albuterol as needed.  Let us know if you need anything.

## 2022-04-24 NOTE — Progress Notes (Signed)
Chief Complaint  Patient presents with   Wheezing    Pt says he would like to discuss the continued asthma issues from last OV.      Subjective: Patient is a 51 y.o. male here for f/u asthma.  Hx of asthma. Has coughing, wheezing, SOB, chest tightness. Compliant w Pulmicort. Remembers to rinse his mouth out after he uses. Flonase was helpful for nasal s/s's. Allergy s/s's improved. No runny/stuffy nose, ST, fevers, ear pain/drainage, sinus pain.   Past Medical History:  Diagnosis Date   Anxiety    Arthritis    Arthritis    Cataract    Chronic kidney disease    Depression    Hypertension    Mild persistent asthma without complication 2/59/5638   OSA (obstructive sleep apnea) 06/24/2017   Plaque psoriasis     Objective: BP 120/80 (BP Location: Left Arm, Patient Position: Sitting, Cuff Size: Large)   Pulse 86   Temp 97.7 F (36.5 C) (Temporal)   Resp 18   Ht 5\' 7"  (1.702 m)   Wt 281 lb 9.6 oz (127.7 kg)   SpO2 93%   BMI 44.10 kg/m  General: Awake, appears stated age Heart: RRR HEENT: PERRLA, MMM, no pharyngeal exudate or erythema, nares patent w/o rhinorrhea Lungs: decreased air flow; no accessory muscle use Psych: Age appropriate judgment and insight, normal affect and mood  Assessment and Plan: Moderate persistent asthma, unspecified whether complicated - Plan: fluticasone-salmeterol (ADVAIR DISKUS) 250-50 MCG/ACT AEPB  Chronic, unstable. Stop Pulmicort, escalate to Advair 250-50 mcg 2 puffs bid. Rinse mouth out after use. Cont SABA prn, INCS. F/u in 1 mo to reck.  The patient voiced understanding and agreement to the plan.  [place], DO 04/24/22  12:33 PM

## 2022-05-02 DIAGNOSIS — L400 Psoriasis vulgaris: Secondary | ICD-10-CM | POA: Diagnosis not present

## 2022-05-28 ENCOUNTER — Encounter: Payer: Self-pay | Admitting: Family Medicine

## 2022-05-28 ENCOUNTER — Ambulatory Visit: Payer: BC Managed Care – PPO | Admitting: Family Medicine

## 2022-05-28 VITALS — BP 128/86 | HR 84 | Temp 98.5°F | Ht 67.0 in | Wt 286.0 lb

## 2022-05-28 DIAGNOSIS — J4540 Moderate persistent asthma, uncomplicated: Secondary | ICD-10-CM

## 2022-05-28 DIAGNOSIS — Z23 Encounter for immunization: Secondary | ICD-10-CM | POA: Diagnosis not present

## 2022-05-28 MED ORDER — FLUTICASONE-SALMETEROL 500-50 MCG/ACT IN AEPB: 1.0000 | INHALATION_SPRAY | Freq: Two times a day (BID) | RESPIRATORY_TRACT | 2 refills | Status: DC

## 2022-05-28 NOTE — Patient Instructions (Signed)
Remember to rinse your mouth out after using the inhaler.   Let me know if you need any refills.

## 2022-05-28 NOTE — Progress Notes (Signed)
Chief Complaint  Patient presents with   Follow-up    Asthma is better but still having some problems    Michael Solomon is a 51 y.o. male here for asthma.  Currently treated with Advair 250-50 mcg 1 puff bid. Compliance is excellent. Uses rescue inhaler 7-8 times per week. Reports breathing is better but still tight and having coughing. Having some nighttime awakenings.  Past Medical History:  Diagnosis Date   Anxiety    Arthritis    Arthritis    Cataract    Chronic kidney disease    Depression    Hypertension    Mild persistent asthma without complication 5/99/3570   OSA (obstructive sleep apnea) 06/24/2017   Plaque psoriasis     BP 128/86 (BP Location: Left Arm, Patient Position: Sitting, Cuff Size: Large)   Pulse 84   Temp 98.5 F (36.9 C) (Oral)   Ht 5\' 7"  (1.702 m)   Wt 286 lb (129.7 kg)   SpO2 98%   BMI 44.79 kg/m  Gen: Awake, alert HEENT: MMM, nares patent, no polyps Heart: RRR, no LE edema Lungs: CTAB, no accessory muscle use Msk: No clubbing or cyanosis Psych: Age appropriate judgment and insight  Moderate persistent asthma, unspecified whether complicated - Plan: fluticasone-salmeterol (ADVAIR) 500-50 MCG/ACT AEPB  Need for influenza vaccination - Plan: Flu Vaccine QUAD 6+ mos PF IM (Fluarix Quad PF)  Chronic, not fully controlled.  Increase Advair dosage to 500-50 mcg 1 puff twice daily.  Rinse mouth out after use.  Follow-up in 1 month, if no improvement will add Singulair 10 mg daily and refer to the allergist. Flu shot today. The patient voiced understanding and agreement to the plan.  [place], DO 05/28/22 11:47 AM

## 2022-06-07 ENCOUNTER — Encounter: Payer: Self-pay | Admitting: Family Medicine

## 2022-06-22 ENCOUNTER — Telehealth: Payer: Self-pay | Admitting: Family Medicine

## 2022-06-22 DIAGNOSIS — F419 Anxiety disorder, unspecified: Secondary | ICD-10-CM

## 2022-06-22 MED ORDER — CLONAZEPAM 0.5 MG PO TABS: ORAL_TABLET | ORAL | 5 refills | Status: DC

## 2022-06-22 NOTE — Telephone Encounter (Signed)
Last RF---#60 with 5 refills on 11/13/2021 #60 with 5 refills. Last OV 05/28/22

## 2022-07-02 ENCOUNTER — Ambulatory Visit: Payer: Self-pay | Admitting: Family Medicine

## 2022-07-16 ENCOUNTER — Telehealth: Payer: Self-pay | Admitting: Family Medicine

## 2022-07-16 NOTE — Telephone Encounter (Signed)
Pt came in office stating is needing a letter from provider indicating that pt is not able to return to work at the moment due to health issues - and lost of family members. Pt stated during his last visit with provider (05-28-2022) provider was aware of his situation. Pt is needing letter for [facility]al [facility]), pt requested to have letter sent Fax # [phone]. (Pt dropped off copy of [facility]- put at front office tray under providers name) Please advise. (Pt state any question to contact him at [facility] [phone]) Pt aware could possible need an appt with provider if needing any clarification.

## 2022-07-16 NOTE — Telephone Encounter (Signed)
Called to schedule appointment to do paperwork,  The patient states he does not have insurance currently. Was informed would let PCP know but may need to do a self pay visit.

## 2022-07-16 NOTE — Telephone Encounter (Signed)
Patient declines appointment at this time, he has no insurance at this time. He will call back to schedule when things change

## 2022-08-06 DIAGNOSIS — Z419 Encounter for procedure for purposes other than remedying health state, unspecified: Secondary | ICD-10-CM | POA: Diagnosis not present

## 2022-08-16 DIAGNOSIS — Z111 Encounter for screening for respiratory tuberculosis: Secondary | ICD-10-CM | POA: Diagnosis not present

## 2022-08-16 DIAGNOSIS — R531 Weakness: Secondary | ICD-10-CM | POA: Diagnosis not present

## 2022-08-16 DIAGNOSIS — L400 Psoriasis vulgaris: Secondary | ICD-10-CM | POA: Diagnosis not present

## 2022-08-17 ENCOUNTER — Other Ambulatory Visit: Payer: Self-pay

## 2022-08-17 MED ORDER — MELOXICAM 15 MG PO TABS: ORAL_TABLET | ORAL | 0 refills | Status: DC

## 2022-08-23 ENCOUNTER — Telehealth: Payer: Self-pay

## 2022-08-23 ENCOUNTER — Other Ambulatory Visit: Payer: Self-pay | Admitting: Family Medicine

## 2022-08-23 MED ORDER — TRAMADOL HCL 50 MG PO TABS: 50.0000 mg | ORAL_TABLET | Freq: Four times a day (QID) | ORAL | 2 refills | Status: DC | PRN

## 2022-08-23 NOTE — Telephone Encounter (Signed)
PA initiated via Covermymeds; KEY: [id number]                                 Awaiting determination.

## 2022-08-23 NOTE — Telephone Encounter (Signed)
Patient's insurance has been reinstated but they listed incorrect provider on the card so he has to wait for this to be updated before he can come in for a visit. Patient needs a refill on Tramadol to get him through until provider is corrected with insurance. Please send to [facility]. Please call patient to advise if unable to give another refill.

## 2022-08-24 NOTE — Telephone Encounter (Signed)
PA approved.   Approved. This drug has been approved. Approved quantity: 30 tablets per 30 day(s). You may fill up to a 34 day supply at a retail pharmacy. You may fill up to a 90 day supply for maintenance drugs, please refer to the formulary for details. Please call the pharmacy to process your prescription claim.

## 2022-08-24 NOTE — Telephone Encounter (Signed)
Called informed the patient of PA approval. 

## 2022-08-28 ENCOUNTER — Ambulatory Visit: Payer: Self-pay | Admitting: Family Medicine

## 2022-09-05 ENCOUNTER — Telehealth: Payer: Self-pay

## 2022-09-05 ENCOUNTER — Encounter: Payer: Self-pay | Admitting: Family Medicine

## 2022-09-05 ENCOUNTER — Ambulatory Visit (INDEPENDENT_AMBULATORY_CARE_PROVIDER_SITE_OTHER): Payer: Medicaid Other | Admitting: Family Medicine

## 2022-09-05 VITALS — BP 146/98 | HR 87 | Temp 98.3°F | Ht 67.0 in | Wt 291.5 lb

## 2022-09-05 DIAGNOSIS — I10 Essential (primary) hypertension: Secondary | ICD-10-CM | POA: Diagnosis not present

## 2022-09-05 DIAGNOSIS — Z0000 Encounter for general adult medical examination without abnormal findings: Secondary | ICD-10-CM

## 2022-09-05 DIAGNOSIS — M79605 Pain in left leg: Secondary | ICD-10-CM

## 2022-09-05 DIAGNOSIS — R0683 Snoring: Secondary | ICD-10-CM | POA: Diagnosis not present

## 2022-09-05 DIAGNOSIS — Z79899 Other long term (current) drug therapy: Secondary | ICD-10-CM

## 2022-09-05 DIAGNOSIS — M545 Low back pain, unspecified: Secondary | ICD-10-CM

## 2022-09-05 DIAGNOSIS — M79604 Pain in right leg: Secondary | ICD-10-CM | POA: Diagnosis not present

## 2022-09-05 DIAGNOSIS — Z125 Encounter for screening for malignant neoplasm of prostate: Secondary | ICD-10-CM | POA: Diagnosis not present

## 2022-09-05 LAB — LIPID PANEL
Cholesterol: 174 mg/dL (ref 0–200)
HDL: 46.2 mg/dL (ref >39.00)
LDL Cholesterol: 91 mg/dL (ref 0–99)
NonHDL: 127.6
Total CHOL/HDL Ratio: 4
Triglycerides: 183 mg/dL — ABNORMAL HIGH (ref 0.0–149.0)
VLDL: 36.6 mg/dL (ref 0.0–40.0)

## 2022-09-05 LAB — COMPREHENSIVE METABOLIC PANEL
ALT: 23 U/L (ref 0–53)
AST: 18 U/L (ref 0–37)
Albumin: 4.3 g/dL (ref 3.5–5.2)
Alkaline Phosphatase: 108 U/L (ref 39–117)
BUN: 16 mg/dL (ref 6–23)
CO2: 33 mEq/L — ABNORMAL HIGH (ref 19–32)
Calcium: 9 mg/dL (ref 8.4–10.5)
Chloride: 100 mEq/L (ref 96–112)
Creatinine, Ser: 0.9 mg/dL (ref 0.40–1.50)
GFR: 100.33 mL/min (ref >60.00)
Glucose, Bld: 89 mg/dL (ref 70–99)
Potassium: 4 mEq/L (ref 3.5–5.1)
Sodium: 139 mEq/L (ref 135–145)
Total Bilirubin: 0.6 mg/dL (ref 0.2–1.2)
Total Protein: 7.5 g/dL (ref 6.0–8.3)

## 2022-09-05 LAB — CBC
HCT: 46.9 % (ref 39.0–52.0)
Hemoglobin: 15.6 g/dL (ref 13.0–17.0)
MCHC: 33.3 g/dL (ref 30.0–36.0)
MCV: 92.2 fl (ref 78.0–100.0)
Platelets: 226 10*3/uL (ref 150.0–400.0)
RBC: 5.09 Mil/uL (ref 4.22–5.81)
RDW: 15.5 % (ref 11.5–15.5)
WBC: 10 10*3/uL (ref 4.0–10.5)

## 2022-09-05 LAB — PSA: PSA: 0.17 ng/mL (ref 0.10–4.00)

## 2022-09-05 MED ORDER — TERBINAFINE HCL 250 MG PO TABS: 250.0000 mg | ORAL_TABLET | Freq: Every day | ORAL | 3 refills | Status: DC

## 2022-09-05 MED ORDER — QUETIAPINE FUMARATE 25 MG PO TABS: 25.0000 mg | ORAL_TABLET | Freq: Every day | ORAL | 1 refills | Status: DC

## 2022-09-05 NOTE — Telephone Encounter (Signed)
[facility] informed of approved and terms. Had received fax from that pharmacy wanting [place] to let them know of approval They will get the prescription ready and contact the patient.

## 2022-09-05 NOTE — Telephone Encounter (Signed)
PA initiated via Covermymeds; KEY: [id number]. Awaiting determination.

## 2022-09-05 NOTE — Patient Instructions (Addendum)
Give [place] 2-3 business days to get the results of your labs back.   Keep the diet clean and stay active.  If you do not hear anything about your referral in the next 1-2 weeks, call our office and ask for an update.  Please get me a copy of your advanced directive form at your convenience.   Check your blood pressures 2-3 times per week, alternating the time of day you check it. If it is high, considering waiting 1-2 minutes and rechecking. If it gets higher, your anxiety is likely creeping up and we should avoid rechecking.   Please consider counseling. Contact [phone] to schedule an appointment or inquire about cost/insurance coverage.  [facility] located at [place], [place], [place].  Phone number = [phone].  Dr. Lennice Sites - Adult Psychiatry.    [facility] located at [place], [place], [place]. Phone number = [phone].   [facility] located at [address] [place], [place].  Phone number = [phone].   [facility] located at [place], [place], [place].  Phone number = [phone].  Coping skills Choose 5 that work for you: Take a deep breath Count to 20 Read a book Do a puzzle Meditate Bake Sing Knit Garden Pray Go outside Call a friend Listen to music Take a walk Color Send a note Take a bath Watch a movie Be alone in a quiet place Pet an animal Visit a friend Journal Exercise Stretch   Let us know if you need anything.

## 2022-09-05 NOTE — Progress Notes (Signed)
Chief Complaint  Patient presents with   Follow-up    Well Male Michael Solomon is here for a complete physical.   His last physical was >1 year ago.  Current diet: in general, diet/exercise as above.   Current exercise: none Weight trend: increased Fatigue out of ordinary?  Yes, he does snore at night and has a history of apnea but his previous insurance was not covering CPAP machine/supplies. Seat belt? Yes.   Advanced directive? No  Health maintenance Tetanus- Yes HIV- Yes Hep C- Yes  Hypertension Patient presents for hypertension follow up. He does not monitor home blood pressures. He is compliant with medication- lisinopril 20 mg/d. Patient has these side effects of medication: none Diet/exercise as above.   Anxiety/depression: He has been thinking about his late brother recently.  He is tearful much of the time.  His mood is depressed and he has higher levels of anxiety and stress.  He is not following with a therapist.  No homicidal or suicidal ideation.  No self-medication.  He is currently taking Paxil 40 mg daily and Klonopin as needed though ends up being daily.  He has a history of toenail fungus on his right great toe.  It is not painful but he does not like the way it looks.  He is interested in oral medication for this.  Past Medical History:  Diagnosis Date   Anxiety    Arthritis    Arthritis    Cataract    Chronic kidney disease    Depression    Hypertension    Mild persistent asthma without complication 0/05/2724   OSA (obstructive sleep apnea) 06/24/2017   Plaque psoriasis      Past Surgical History:  Procedure Laterality Date   EYE SURGERY Bilateral     Medications  Current Outpatient Medications on File Prior to Visit  Medication Sig Dispense Refill   ammonium lactate (LAC-HYDRIN) 12 % lotion APPLY TO THE AFFECTED AREAS OF SKIN BID PRN  2   busPIRone (BUSPAR) 10 MG tablet Take 1 tablet (10 mg total) by mouth 2 (two) times daily. 180 tablet 2    clobetasol (OLUX) 0.05 % topical foam Apply topically 2 (two) times daily. 50 g 1   clobetasol cream (TEMOVATE) 3.66 % Apply 1 application topically 2 (two) times daily. 60 g 1   clonazePAM (KLONOPIN) 0.5 MG tablet TAKE 1 TABLET(0.5 MG) BY MOUTH TWICE DAILY AS NEEDED FOR ANXIETY 60 tablet 5   fluticasone (FLONASE) 50 MCG/ACT nasal spray Place 2 sprays into both nostrils daily. 16 g 2   fluticasone-salmeterol (ADVAIR) 500-50 MCG/ACT AEPB Inhale 1 puff into the lungs in the morning and at bedtime. 60 each 2   guaiFENesin (MUCINEX) 600 MG 12 hr tablet Take 2 tablets (1,200 mg total) by mouth 2 (two) times daily. 30 tablet 2   lisinopril (ZESTRIL) 20 MG tablet Take 1 tablet (20 mg total) by mouth daily. 30 tablet 3   meloxicam (MOBIC) 15 MG tablet TAKE 1 TABLET(15 MG) BY MOUTH DAILY 90 tablet 0   PARoxetine (PAXIL) 40 MG tablet Take 1 tablet (40 mg total) by mouth every morning. 90 tablet 2   SKYRIZI PEN 150 MG/ML SOAJ Inject into the skin.     Spacer/Aero-Hold Chamber Mask MISC Use with albuterol. 2 each 2   Talazoparib Tosylate (TALZENNA PO) Take by mouth.     traMADol (ULTRAM) 50 MG tablet Take 1 tablet (50 mg total) by mouth every 6 (six) hours as needed for moderate  pain. 30 tablet 2   Allergies No Known Allergies  Family History Family History  Problem Relation Age of Onset   Cancer Mother    Dementia Mother    Hypertension Mother    Thyroid disease Mother    Diabetes Father    Heart disease Father    Stroke Father    Hypertension Father    Diabetes Brother    Depression Brother     Review of Systems: Constitutional: no fevers or chills Eye:  no recent significant change in vision Ear/Nose/Mouth/Throat:  Ears:  no hearing loss Nose/Mouth/Throat:  no complaints of nasal congestion, no sore throat Cardiovascular:  no chest pain Respiratory:  no shortness of breath Gastrointestinal:  no abdominal pain, no change in bowel habits GU:  Male: negative for dysuria, frequency, and  incontinence Musculoskeletal/Extremities:  no pain of the joints Integumentary (Skin/Breast): + Thickened and dark toenail Neurologic:  no headaches Endocrine: No unexpected weight changes Hematologic/Lymphatic:  no night sweats  Exam BP (!) 146/98 (BP Location: Left Arm, Cuff Size: Large)   Pulse 87   Temp 98.3 F (36.8 C) (Oral)   Ht 5\' 7"  (1.702 m)   Wt 291 lb 8 oz (132.2 kg)   SpO2 93%   BMI 45.66 kg/m  General:  well developed, well nourished, in no apparent distress Skin: Over the right great toe, there is hyperkeratinization and discoloration of the nail plate without erythema or tenderness with pressure upon the plate.  Otherwise, no significant moles, warts, or growths Head:  no masses, lesions, or tenderness Eyes:  pupils equal and round, sclera anicteric without injection Ears:  canals without lesions, TMs shiny without retraction, no obvious effusion, no erythema Nose:  nares patent, mucosa normal Throat/Pharynx:  lips and gingiva without lesion; tongue and uvula midline; non-inflamed pharynx; no exudates or postnasal drainage Neck: neck supple without adenopathy, thyromegaly, or masses Lungs:  clear to auscultation, breath sounds equal bilaterally, no respiratory distress Cardio:  regular rate and rhythm, no bruits, no LE edema Abdomen:  abdomen soft, nontender; bowel sounds normal; no masses or organomegaly Rectal: Deferred Musculoskeletal:  symmetrical muscle groups noted without atrophy or deformity Extremities:  no clubbing, cyanosis, or edema, no deformities, no skin discoloration Neuro:  gait normal; deep tendon reflexes normal and symmetric Psych: well oriented with normal range of affect and appropriate judgment/insight  Assessment and Plan  Well adult exam - Plan: CBC, Comprehensive metabolic panel, Lipid panel  Screening for prostate cancer - Plan: PSA  Low back pain radiating to both legs - Plan: Ambulatory referral to Pain Clinic  Snoring - Plan:  Ambulatory referral to Neurology  Encounter for long-term (current) use of high-risk medication - Plan: Drug Monitoring Panel [phone] , Urine  Essential hypertension   Well 52 y.o. male. Counseled on diet and exercise. Counseled on risks and benefits of prostate cancer screening with PSA. The patient agrees to undergo screening.  Other orders as above. HTN: His diet is admittedly worse recently.  He will focus on improving it and monitor his blood pressure at home.  Follow-up in 1 month. Depression/anxiety: UDS/CSC updated today.  He does not want to see a therapist but I did provide counseling information in his paperwork.  Anxiety coping techniques provided.  Continue Paxil 40 mg daily and Klonopin as needed.  Continue BuSpar 10 mg twice daily.  Add Seroquel 25 mg nightly.  Would consider uptitrating this or trying another atypical antipsychotic if no improvement.  Getting closer to strongly recommending a  psychiatrist. We will refer him to a pain management specialist and a sleep specialist.  These issues could be contributing to the above as well as being caused by the above. Advanced directive form provided today.  The patient voiced understanding and agreement to the plan.  [place], DO 09/05/22 11:57 AM

## 2022-09-05 NOTE — Telephone Encounter (Signed)
PA approved.   Approved. This drug has been approved. Approved quantity: 30 tablets per 30 day(s). You may fill up to a 34 day supply at a retail pharmacy. You may fill up to a 90 day supply for maintenance drugs, please refer to the formulary for details. Please call the pharmacy to process your prescription claim.

## 2022-09-06 DIAGNOSIS — Z419 Encounter for procedure for purposes other than remedying health state, unspecified: Secondary | ICD-10-CM | POA: Diagnosis not present

## 2022-09-07 ENCOUNTER — Encounter: Payer: Self-pay | Admitting: Physical Medicine & Rehabilitation

## 2022-09-07 LAB — DRUG MONITORING PANEL 376104, URINE
Amphetamines: NEGATIVE ng/mL (ref <500)
Barbiturates: NEGATIVE ng/mL (ref <300)
Benzodiazepines: NEGATIVE ng/mL (ref <100)
Cocaine Metabolite: NEGATIVE ng/mL (ref <150)
Desmethyltramadol: NEGATIVE ng/mL (ref <100)
Opiates: NEGATIVE ng/mL (ref <100)
Oxycodone: NEGATIVE ng/mL (ref <100)
Tramadol: NEGATIVE ng/mL (ref <100)

## 2022-09-07 LAB — DM TEMPLATE

## 2022-09-27 ENCOUNTER — Telehealth: Payer: Self-pay | Admitting: Family Medicine

## 2022-09-27 ENCOUNTER — Encounter: Payer: Medicaid Other | Admitting: Physical Medicine & Rehabilitation

## 2022-09-27 NOTE — Telephone Encounter (Signed)
Patient said [facility] has not received referral. Please review and resend referral and notify patient.

## 2022-10-03 ENCOUNTER — Ambulatory Visit (INDEPENDENT_AMBULATORY_CARE_PROVIDER_SITE_OTHER): Payer: Medicaid Other | Admitting: Family Medicine

## 2022-10-03 ENCOUNTER — Encounter: Payer: Self-pay | Admitting: Family Medicine

## 2022-10-03 VITALS — BP 128/82 | HR 90 | Temp 98.0°F | Ht 67.0 in | Wt 290.5 lb

## 2022-10-03 DIAGNOSIS — J4541 Moderate persistent asthma with (acute) exacerbation: Secondary | ICD-10-CM

## 2022-10-03 DIAGNOSIS — J302 Other seasonal allergic rhinitis: Secondary | ICD-10-CM

## 2022-10-03 DIAGNOSIS — U071 COVID-19: Secondary | ICD-10-CM

## 2022-10-03 DIAGNOSIS — F419 Anxiety disorder, unspecified: Secondary | ICD-10-CM

## 2022-10-03 MED ORDER — BENZONATATE 200 MG PO CAPS: 200.0000 mg | ORAL_CAPSULE | Freq: Two times a day (BID) | ORAL | 0 refills | Status: DC | PRN

## 2022-10-03 MED ORDER — LEVOCETIRIZINE DIHYDROCHLORIDE 5 MG PO TABS: 5.0000 mg | ORAL_TABLET | Freq: Every evening | ORAL | 2 refills | Status: DC

## 2022-10-03 MED ORDER — PREDNISONE 20 MG PO TABS: 40.0000 mg | ORAL_TABLET | Freq: Every day | ORAL | 0 refills | Status: AC

## 2022-10-03 NOTE — Telephone Encounter (Signed)
Pt said that [facility] in [place] still does not have referral.

## 2022-10-03 NOTE — Patient Instructions (Addendum)
Continue to push fluids, practice good hand hygiene, and cover your mouth if you cough.  If you start having fevers, shaking or shortness of breath, seek immediate care.  OK to take Tylenol 1000 mg (2 extra strength tabs) or 975 mg (3 regular strength tabs) every 6 hours as needed.  For the first 5 days you should quarantine. Day 6-10 you can return to society with a mask as long as you are fever-free and have improving or resolved symptoms. Day 11 and beyond you can return maskless as long as you are fever-free with improved/resolved symptoms.   Call [phone] for your pain management appointment as we have sent the referral. Let me know if there are issues.   Continue taking the Flonase.   Let us know if you need anything.

## 2022-10-03 NOTE — Progress Notes (Signed)
Chief Complaint  Patient presents with   Follow-up    No energy     Subjective: Patient is a 52 y.o. male here for fu.    Duration: 3 days  Associated symptoms: itchy watery eyes, ear fullness, loss of taste/smell, wheezing, and productive cough Denies: sinus congestion, sinus pain, rhinorrhea, ear pain, ear drainage, sore throat, shortness of breath, myalgia, and fevers, N/V/D Treatment to date: Nyquil Sick contacts: Yes- mom w covid, he stays w her frequently He tested positive for COVID at home.  Patient was started on Seroquel 25 mg nightly to help with anxiety and sleep.  He reports compliance and no adverse effects.  It worked quite well for him.  He is content on the current dosage.  He is also taking Paxil 40 mg daily and Klonopin as needed.  He is not following with a therapist or counselor.  No homicidal or suicidal ideation.  No self-medication.  Past Medical History:  Diagnosis Date   Anxiety    Arthritis    Arthritis    Cataract    Chronic kidney disease    Depression    Hypertension    Mild persistent asthma without complication [id number]   OSA (obstructive sleep apnea) 06/24/2017   Plaque psoriasis     Objective: BP 128/82 (BP Location: Left Arm, Cuff Size: Large)   Pulse 90   Temp 98 F (36.7 C) (Oral)   Ht '5\' 7"'$  (1.702 m)   Wt 290 lb 8 oz (131.8 kg)   SpO2 93%   BMI 45.50 kg/m  General: Awake, appears stated age Ears: Canals patent, no otorrhea Nose: No rhinorrhea, patent Mouth: MMM Heart: RRR, no LE edema Lungs: Faint expiratory wheezes heard at the bases.  No accessory muscle use Psych: Age appropriate judgment and insight, normal affect and mood  Assessment and Plan: COVID-19 - Plan: benzonatate (TESSALON) 200 MG capsule  Moderate persistent asthma with (acute) exacerbation - Plan: predniSONE (DELTASONE) 20 MG tablet  Seasonal allergies - Plan: levocetirizine (XYZAL) 5 MG tablet  Anxiety  Seems to be doing better overall from an upper  respiratory symptom standpoint.  Will hold off on Paxlovid.  Tessalon Perles as needed.  We discussed CDC quarantine guidelines and it was written down in AVS. Exacerbation of chronic issue secondary to #1.  5-day prednisone burst 40 mg daily.  Continue home inhalers. Continue Flonase, add Xyzal 5 mg daily.  Follow-up in 1 month to recheck.  If no improvement, will consider Astelin nasal spray versus montelukast versus both. Chronic, improved.  Continue Seroquel 25 mg nightly. The patient voiced understanding and agreement to the plan.  [place], DO 10/03/22  11:39 AM

## 2022-10-04 ENCOUNTER — Institutional Professional Consult (permissible substitution): Payer: Medicaid Other | Admitting: Neurology

## 2022-10-05 DIAGNOSIS — Z419 Encounter for procedure for purposes other than remedying health state, unspecified: Secondary | ICD-10-CM | POA: Diagnosis not present

## 2022-10-25 ENCOUNTER — Ambulatory Visit (INDEPENDENT_AMBULATORY_CARE_PROVIDER_SITE_OTHER): Payer: Medicaid Other | Admitting: Neurology

## 2022-10-25 ENCOUNTER — Encounter: Payer: Self-pay | Admitting: Neurology

## 2022-10-25 VITALS — BP 159/99 | HR 86 | Ht 67.0 in | Wt 288.2 lb

## 2022-10-25 DIAGNOSIS — G4719 Other hypersomnia: Secondary | ICD-10-CM | POA: Diagnosis not present

## 2022-10-25 DIAGNOSIS — G4733 Obstructive sleep apnea (adult) (pediatric): Secondary | ICD-10-CM | POA: Diagnosis not present

## 2022-10-25 DIAGNOSIS — Z6841 Body Mass Index (BMI) 40.0 and over, adult: Secondary | ICD-10-CM | POA: Diagnosis not present

## 2022-10-25 DIAGNOSIS — R635 Abnormal weight gain: Secondary | ICD-10-CM

## 2022-10-25 DIAGNOSIS — R351 Nocturia: Secondary | ICD-10-CM

## 2022-10-25 NOTE — Progress Notes (Signed)
Subjective:    Patient ID: Michael Solomon is a 52 y.o. male.  HPI    Star Age, MD, PhD [facility] [address] [place], [place] [address]  Dear Dr. Nani Ravens,  I saw your patient, Michael Solomon, upon your kind request in my sleep clinic today for initial consultation of his sleep disorder, in particular, concern for underlying obstructive sleep apnea.  The patient is unaccompanied today. He missed an appointment on 10/04/2022. As you know, Michael Solomon is a 52 year old male with an underlying medical history of hypertension, low back pain, asthma, psoriasis, anxiety, depression, arthritis, chronic kidney disease, and severe obesity with a BMI of over 45, who reports snoring and witnessed apneas, as well as tiredness and lack of energy.  His Epworth sleepiness score is 3 out of 24, fatigue severity score is 33 out of 63.  I reviewed your office note from 09/05/2022.  Of note, he was previously diagnosed with obstructive sleep apnea several years ago but has not been on PAP therapy.  He reports that he was told a CPAP was not needed.  I reviewed his home sleep test report from 07/22/2017.  His total AHI was 13/h, O2 nadir was 77% with an average oxygen saturation of 93%.  Study was interpreted by Dr. Elsworth Soho.  Weight was around 243 pounds in December 2018, he has had significant interim weight gain, in the realm of 40 lb. He currently does not work. He helps take care of his mom, who is not in good health. He lives with his wife, stepdaughter and his mom.  He goes to bed generally around 10 or 10:30 PM and rise time is around 8 AM but sleep is interrupted as he is to help his mom at night also.  He goes to the bathroom once or twice per night, denies recurrent nocturnal or morning headaches.  He does not have a TV in his bedroom.  They have 1 small dog in the household, dog does not sleep in the bedroom with them.  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  He does not currently smoke cigarettes but smoked some after he lost his brother and his father both in July 2023.  He does not drink daily caffeine.  His Past Medical History Is Significant For: Past Medical History:  Diagnosis Date   Anxiety    Arthritis    Arthritis    Cataract    Chronic kidney disease    Depression    Hypertension    Mild persistent asthma without complication [id number]   OSA (obstructive sleep apnea) 06/24/2017   Plaque psoriasis     His Past Surgical History Is Significant For: Past Surgical History:  Procedure Laterality Date   EYE SURGERY Bilateral     His Family History Is Significant For: Family History  Problem Relation Age of Onset   Cancer Mother    Dementia Mother    Hypertension Mother    Thyroid disease Mother    Diabetes Father    Heart disease Father    Stroke Father    Hypertension Father    Diabetes Brother    Depression Brother     His Social History Is Significant For: Social History   Socioeconomic History   Marital status: Married    Spouse name: Not on file   Number of children: Not on file   Years of education: Not on file  Highest education level: Not on file  Occupational History   Not on file  Tobacco Use   Smoking status: Light Smoker    Packs/day: 0.50    Years: 25.00    Additional pack years: 0.00    Total pack years: 12.50    Types: Cigarettes   Smokeless tobacco: Never  Vaping Use   Vaping Use: Never used  Substance and Sexual Activity   Alcohol use: No   Drug use: No   Sexual activity: Not on file  Other Topics Concern   Not on file  Social History Narrative   Not on file   Social Determinants of Health   Financial Resource Strain: Not on file  Food Insecurity: Not on file  Transportation Needs: Not on file  Physical Activity: Not on file  Stress: Not on file  Social Connections: Not on file    His Allergies Are:  No Known Allergies:   His  Current Medications Are:  Outpatient Encounter Medications as of 10/25/2022  Medication Sig   benzonatate (TESSALON) 200 MG capsule Take 1 capsule (200 mg total) by mouth 2 (two) times daily as needed for cough.   busPIRone (BUSPAR) 10 MG tablet Take 1 tablet (10 mg total) by mouth 2 (two) times daily.   clonazePAM (KLONOPIN) 0.5 MG tablet TAKE 1 TABLET(0.5 MG) BY MOUTH TWICE DAILY AS NEEDED FOR ANXIETY   fluticasone (FLONASE) 50 MCG/ACT nasal spray Place 2 sprays into both nostrils daily.   fluticasone-salmeterol (ADVAIR) 500-50 MCG/ACT AEPB Inhale 1 puff into the lungs in the morning and at bedtime.   levocetirizine (XYZAL) 5 MG tablet Take 1 tablet (5 mg total) by mouth every evening.   lisinopril (ZESTRIL) 20 MG tablet Take 1 tablet (20 mg total) by mouth daily.   meloxicam (MOBIC) 15 MG tablet TAKE 1 TABLET(15 MG) BY MOUTH DAILY   PARoxetine (PAXIL) 40 MG tablet Take 1 tablet (40 mg total) by mouth every morning.   QUEtiapine (SEROQUEL) 25 MG tablet Take 1 tablet (25 mg total) by mouth at bedtime.   SKYRIZI PEN 150 MG/ML SOAJ Inject into the skin.   traMADol (ULTRAM) 50 MG tablet Take 1 tablet (50 mg total) by mouth every 6 (six) hours as needed for moderate pain.   ammonium lactate (LAC-HYDRIN) 12 % lotion APPLY TO THE AFFECTED AREAS OF SKIN BID PRN   Spacer/Aero-Hold Chamber Mask MISC Use with albuterol. (Patient not taking: Reported on 10/25/2022)   Talazoparib Tosylate (TALZENNA PO) Take by mouth. (Patient not taking: Reported on 10/25/2022)   terbinafine (LAMISIL) 250 MG tablet Take 1 tablet (250 mg total) by mouth daily. (Patient not taking: Reported on 10/25/2022)   No facility-administered encounter medications on file as of 10/25/2022.  :   Review of Systems:  Out of a complete 14 point review of systems, all are reviewed and negative with the exception of these symptoms as listed below:  Review of Systems  Neurological:        Pt here for sleep consult stated that he does  snore,, stops breathing during the night declines headaches, is fatigued, has HTN. Had sleep study in 2018. He never received cpap machine because they stated that no cpap was needed  ESS:3 FSS:33    Objective:  Neurological Exam  Physical Exam Physical Examination:   Vitals:   10/25/22 1108  BP: (!) 159/99  Pulse: 86    General Examination: The patient is a very pleasant 52 y.o. male in no acute distress. He appears  well-developed and well-nourished and well groomed.   HEENT: Normocephalic, atraumatic, pupils are equal, round and reactive to light, extraocular tracking is good without limitation to gaze excursion or nystagmus noted. Hearing is grossly intact. Face is symmetric with normal facial animation. Speech is clear with no dysarthria noted. There is no hypophonia. There is no lip, neck/head, jaw or voice tremor. Neck is supple with full range of passive and active motion. There are no carotid bruits on auscultation. Oropharynx exam reveals: mild mouth dryness, adequate dental hygiene and moderate airway crowding, due to small airway entry and thicker soft palate, tonsillar size of about 2+ bilaterally, wider tongue noted.  Tongue protrudes centrally and palate elevates symmetrically, shorter appearing neck noted, neck circumference 21 1/8 inches, minimal overbite noted.  Chest: Clear to auscultation without wheezing, rhonchi or crackles noted.  Heart: S1+S2+0, regular and normal without murmurs, rubs or gallops noted.   Abdomen: Soft, non-tender and non-distended.  Extremities: There is no pitting edema in the distal lower extremities bilaterally.   Skin: Warm and dry with patchy psoriatic lesions noted particularly in the shin areas bilaterally, also forearms and elbow areas.   Musculoskeletal: exam reveals no obvious joint deformities.   Neurologically:  Mental status: The patient is awake, alert and oriented in all 4 spheres. His immediate and remote memory, attention,  language skills and fund of knowledge are appropriate. There is no evidence of aphasia, agnosia, apraxia or anomia. Speech is clear with normal prosody and enunciation. Thought process is linear. Mood is normal and affect is normal.  Cranial nerves II - XII are as described above under HEENT exam.  Motor exam: Normal bulk, strength and tone is noted. There is no obvious action or resting tremor.  Fine motor skills and coordination: grossly intact.  Cerebellar testing: No dysmetria or intention tremor. There is no truncal or gait ataxia.  Sensory exam: intact to light touch in the upper and lower extremities.  Gait, station and balance: He stands easily. No veering to one side is noted. No leaning to one side is noted. Posture is age-appropriate and stance is narrow based. Gait shows normal stride length and normal pace. No problems turning are noted.   Assessment and plan:  In summary, Michael Solomon is a very pleasant 52 y.o.-year old male with an underlying medical history of hypertension, low back pain, asthma, psoriasis, anxiety, depression, arthritis, chronic kidney disease, and severe obesity with a BMI of over 45, who presents for evaluation of his sleep disordered breathing.  He was diagnosed with sleep apnea in 2018 but has not been on PAP therapy.  He has had significant interim weight gain.  His history and physical exam are concerning for sleep disordered breathing, particularly obstructive sleep apnea (OSA).  While a laboratory attended sleep study is typically considered "gold standard" for evaluation of sleep disordered breathing, we mutually agreed to proceed with a home sleep test at this time.   I had a long chat with the patient about my findings and the diagnosis of sleep apnea, particularly OSA, its prognosis and treatment options. We talked about medical/conservative treatments, surgical interventions and non-pharmacological approaches for symptom control. I explained, in particular,  the risks and ramifications of untreated moderate to severe OSA, especially with respect to developing cardiovascular disease down the road, including congestive heart failure (CHF), difficult to treat hypertension, cardiac arrhythmias (particularly A-fib), neurovascular complications including TIA, stroke and dementia. Even type 2 diabetes has, in part, been linked to untreated OSA. Symptoms of  untreated OSA may include (but may not be limited to) daytime sleepiness, nocturia (i.e. frequent nighttime urination), memory problems, mood irritability and suboptimally controlled or worsening mood disorder such as depression and/or anxiety, lack of energy, lack of motivation, physical discomfort, as well as recurrent headaches, especially morning or nocturnal headaches. We talked about the importance of maintaining a healthy lifestyle and striving for healthy weight.  The importance of complete smoking cessation was also addressed.  In addition, we talked about the importance of striving for and maintaining good sleep hygiene. I recommended a sleep study at this time. I outlined the differences between a laboratory attended sleep study which is considered more comprehensive and accurate over the option of a home sleep test (HST); the latter may lead to underestimation of sleep disordered breathing in some instances and does not help with diagnosing upper airway resistance syndrome and is not accurate enough to diagnose primary central sleep apnea typically. I outlined possible surgical and non-surgical treatment options of OSA, including the use of a positive airway pressure (PAP) device (i.e. CPAP, AutoPAP/APAP or BiPAP in certain circumstances), a custom-made dental device (aka oral appliance, which would require a referral to a specialist dentist or orthodontist typically, and is generally speaking not considered for patients with full dentures or edentulous state), upper airway surgical options, such as traditional  UPPP (which is not considered a first-line treatment) or the Inspire device (hypoglossal nerve stimulator, which would involve a referral for consultation with an ENT surgeon, after careful selection, following inclusion criteria - also not first-line treatment). I explained the PAP treatment option to the patient in detail, as this is generally considered first-line treatment.  The patient indicated that he would be willing to try PAP therapy, if the need arises. I explained the importance of being compliant with PAP treatment, not only for insurance purposes but primarily to improve patient's symptoms symptoms, and for the patient's long term health benefit, including to reduce His cardiovascular risks longer-term.  I was able to show him a CPAP/AutoPap machine model in the clinic today.   We will pick up our discussion about the next steps and treatment options after testing.  We will keep him posted as to the test results by phone call and/or MyChart messaging where possible.  We will plan to follow-up in sleep clinic accordingly as well.  I answered all his questions today and the patient was in agreement.   I encouraged him to call with any interim questions, concerns, problems or updates or email [place] through [facility].  Generally speaking, sleep test authorizations may take up to 2 weeks, sometimes less, sometimes longer, the patient is encouraged to get in touch with [place] if they do not hear back from the sleep lab staff directly within the next 2 weeks.  Thank you very much for allowing me to participate in the care of this nice patient. If I can be of any further assistance to you please do not hesitate to call me at [phone].  Sincerely,   Star Age, MD, PhD

## 2022-10-25 NOTE — Patient Instructions (Signed)

## 2022-10-31 ENCOUNTER — Encounter: Payer: Self-pay | Admitting: Family Medicine

## 2022-10-31 ENCOUNTER — Ambulatory Visit (INDEPENDENT_AMBULATORY_CARE_PROVIDER_SITE_OTHER): Payer: Medicaid Other | Admitting: Family Medicine

## 2022-10-31 VITALS — BP 120/85 | HR 88 | Temp 98.6°F | Ht 67.0 in | Wt 289.5 lb

## 2022-10-31 DIAGNOSIS — M79605 Pain in left leg: Secondary | ICD-10-CM | POA: Diagnosis not present

## 2022-10-31 DIAGNOSIS — J302 Other seasonal allergic rhinitis: Secondary | ICD-10-CM | POA: Diagnosis not present

## 2022-10-31 DIAGNOSIS — M79604 Pain in right leg: Secondary | ICD-10-CM | POA: Diagnosis not present

## 2022-10-31 DIAGNOSIS — G894 Chronic pain syndrome: Secondary | ICD-10-CM | POA: Diagnosis not present

## 2022-10-31 DIAGNOSIS — M545 Low back pain, unspecified: Secondary | ICD-10-CM | POA: Diagnosis not present

## 2022-10-31 DIAGNOSIS — Z1389 Encounter for screening for other disorder: Secondary | ICD-10-CM | POA: Diagnosis not present

## 2022-10-31 DIAGNOSIS — M47816 Spondylosis without myelopathy or radiculopathy, lumbar region: Secondary | ICD-10-CM | POA: Diagnosis not present

## 2022-10-31 DIAGNOSIS — M5136 Other intervertebral disc degeneration, lumbar region: Secondary | ICD-10-CM | POA: Diagnosis not present

## 2022-10-31 MED ORDER — QUETIAPINE FUMARATE 25 MG PO TABS: 25.0000 mg | ORAL_TABLET | Freq: Every day | ORAL | 1 refills | Status: DC

## 2022-10-31 MED ORDER — FLUTICASONE PROPIONATE 50 MCG/ACT NA SUSP: 2.0000 | Freq: Every day | NASAL | 2 refills | Status: DC

## 2022-10-31 MED ORDER — LEVOCETIRIZINE DIHYDROCHLORIDE 5 MG PO TABS: 5.0000 mg | ORAL_TABLET | Freq: Every evening | ORAL | 2 refills | Status: AC

## 2022-10-31 MED ORDER — VENLAFAXINE HCL ER 150 MG PO CP24: 150.0000 mg | ORAL_CAPSULE | Freq: Every day | ORAL | 2 refills | Status: DC

## 2022-10-31 NOTE — Progress Notes (Signed)
Chief Complaint  Patient presents with   Follow-up    Back pain     Subjective: Patient is a 52 y.o. male here for f/u.  Started on Xyzal 5 mg/d along w INCS. Reports significant improvement, no AE's. Compliant w meds.   Chronic back pain Hx of this, starting w PM&R who is initiating tx/workup. Requesting pain meds today. He was taking Tramadol but reporting it is not helpful.   Past Medical History:  Diagnosis Date   Anxiety    Arthritis    Arthritis    Cataract    Chronic kidney disease    Depression    Hypertension    Mild persistent asthma without complication [id number]   OSA (obstructive sleep apnea) 06/24/2017   Plaque psoriasis     Objective: BP 120/85 (BP Location: Left Arm, Patient Position: Sitting, Cuff Size: Large)   Pulse 88   Temp 98.6 F (37 C) (Oral)   Ht 5\' 7"  (1.702 m)   Wt 289 lb 8 oz (131.3 kg)   SpO2 94%   BMI 45.34 kg/m  General: Awake, appears stated age Heart: RRR, no LE edema Lungs: CTAB, no rales, wheezes or rhonchi. No accessory muscle use HEENT: Canals are patent without otorrhea, TMs negative bilaterally, nares are patent without rhinorrhea, no turbinate edema, MMM, no pharyngeal exudate or erythema MSK: Tight lower thoracic/upper lumbar paraspinal musculature bilaterally, no midline tenderness, no SI joint TTP, tight hamstrings bilaterally Neuro: No cerebellar signs, gait is normal Psych: Age appropriate judgment and insight, normal affect and mood  Assessment and Plan: Seasonal allergies - Plan: levocetirizine (XYZAL) 5 MG tablet  Low back pain radiating to both legs - Plan: venlafaxine XR (EFFEXOR XR) 150 MG 24 hr capsule  Continue Flonase and Xyzal. Chronic, uncontrolled.  Stop Paxil, changed to Effexor 150 mg daily.  Hopefully the norepinephrine aspect will help with chronic pain.  Tylenol, ice, heat, ibuprofen, physical therapy through the PM&R team.  Follow-up in 2 months to recheck this. The patient voiced understanding and  agreement to the plan.  [place], DO 10/31/22  11:30 AM

## 2022-10-31 NOTE — Patient Instructions (Signed)
Heat (pad or rice pillow in microwave) over affected area, 10-15 minutes twice daily.   Ice/cold pack over area for 10-15 min twice daily.  OK to take Tylenol 1000 mg (2 extra strength tabs) or 975 mg (3 regular strength tabs) every 6 hours as needed.  Let us know if you need anything.  

## 2022-11-05 DIAGNOSIS — Z419 Encounter for procedure for purposes other than remedying health state, unspecified: Secondary | ICD-10-CM | POA: Diagnosis not present

## 2022-11-08 ENCOUNTER — Encounter: Payer: Medicaid Other | Attending: Physical Medicine & Rehabilitation | Admitting: Physical Medicine & Rehabilitation

## 2022-11-15 DIAGNOSIS — H5213 Myopia, bilateral: Secondary | ICD-10-CM | POA: Diagnosis not present

## 2022-11-22 ENCOUNTER — Telehealth: Payer: Self-pay | Admitting: Neurology

## 2022-11-22 NOTE — Telephone Encounter (Signed)
HST- [facility]: [id number] (exp. 11/08/22 to 02/06/23)   Patient is scheduled at [facility] For 12/04/22 at 1 pm.  Mailed packet to the patient.

## 2022-11-28 DIAGNOSIS — Z1389 Encounter for screening for other disorder: Secondary | ICD-10-CM | POA: Diagnosis not present

## 2022-11-28 DIAGNOSIS — M5136 Other intervertebral disc degeneration, lumbar region: Secondary | ICD-10-CM | POA: Diagnosis not present

## 2022-11-28 DIAGNOSIS — G894 Chronic pain syndrome: Secondary | ICD-10-CM | POA: Diagnosis not present

## 2022-11-28 DIAGNOSIS — M47816 Spondylosis without myelopathy or radiculopathy, lumbar region: Secondary | ICD-10-CM | POA: Diagnosis not present

## 2022-12-03 NOTE — Telephone Encounter (Signed)
Patient left a voicemail on my phone asking to r/s his HST  I called the patient and spoke with him he is r/s for 12/18/22 at 1:45 pm.  Mailed new packet to the patient.

## 2022-12-05 DIAGNOSIS — Z419 Encounter for procedure for purposes other than remedying health state, unspecified: Secondary | ICD-10-CM | POA: Diagnosis not present

## 2022-12-27 DIAGNOSIS — G894 Chronic pain syndrome: Secondary | ICD-10-CM | POA: Diagnosis not present

## 2022-12-27 DIAGNOSIS — M5136 Other intervertebral disc degeneration, lumbar region: Secondary | ICD-10-CM | POA: Diagnosis not present

## 2022-12-27 DIAGNOSIS — M47816 Spondylosis without myelopathy or radiculopathy, lumbar region: Secondary | ICD-10-CM | POA: Diagnosis not present

## 2022-12-27 DIAGNOSIS — Z1389 Encounter for screening for other disorder: Secondary | ICD-10-CM | POA: Diagnosis not present

## 2023-01-01 ENCOUNTER — Ambulatory Visit: Payer: Medicaid Other | Admitting: Family Medicine

## 2023-01-05 DIAGNOSIS — Z419 Encounter for procedure for purposes other than remedying health state, unspecified: Secondary | ICD-10-CM | POA: Diagnosis not present

## 2023-01-07 ENCOUNTER — Ambulatory Visit: Payer: Medicaid Other | Admitting: Family Medicine

## 2023-01-18 ENCOUNTER — Ambulatory Visit (INDEPENDENT_AMBULATORY_CARE_PROVIDER_SITE_OTHER): Payer: Medicaid Other | Admitting: Family Medicine

## 2023-01-18 ENCOUNTER — Encounter: Payer: Self-pay | Admitting: Family Medicine

## 2023-01-18 VITALS — BP 128/84 | HR 102 | Temp 99.2°F | Ht 67.0 in | Wt 289.2 lb

## 2023-01-18 DIAGNOSIS — R140 Abdominal distension (gaseous): Secondary | ICD-10-CM

## 2023-01-18 DIAGNOSIS — I10 Essential (primary) hypertension: Secondary | ICD-10-CM

## 2023-01-18 DIAGNOSIS — R1310 Dysphagia, unspecified: Secondary | ICD-10-CM

## 2023-01-18 MED ORDER — PANTOPRAZOLE SODIUM 40 MG PO TBEC: 40.0000 mg | DELAYED_RELEASE_TABLET | Freq: Every day | ORAL | 1 refills | Status: DC

## 2023-01-18 MED ORDER — LISINOPRIL 20 MG PO TABS: 20.0000 mg | ORAL_TABLET | Freq: Every day | ORAL | 3 refills | Status: DC

## 2023-01-18 NOTE — Patient Instructions (Signed)
If you do not hear anything about your referral in the next 1-2 weeks, call our office and ask for an update.  Keep the diet clean and stay active.  Let us know if you need anything. 

## 2023-01-18 NOTE — Progress Notes (Signed)
Chief Complaint  Patient presents with   Follow-up    2 month     Subjective: Patient is a 52 y.o. male here for f/u.  Changed from Paxil to Effexor. Started on hydrocodone from pain doc. Pain better overall. Mood is stable, has some stress in life right now and is looking for apartments.   Bloating after eating for a few weeks. Pain over bellybutton area. No N/V/D, bleeding, constipation. Feels like his food is getting stuck in his upper chest region.   Past Medical History:  Diagnosis Date   Anxiety    Arthritis    Arthritis    Cataract    Chronic kidney disease    Depression    Hypertension    Mild persistent asthma without complication 09/28/2021   OSA (obstructive sleep apnea) 06/24/2017   Plaque psoriasis     Objective: BP 128/84 (BP Location: Left Arm, Cuff Size: Large)   Pulse (!) 102   Temp 99.2 F (37.3 C) (Oral)   Ht 5\' 7"  (1.702 m)   Wt 289 lb 4 oz (131.2 kg)   SpO2 97%   BMI 45.30 kg/m  General: Awake, appears stated age Heart: RRR, no LE edema Lungs: CTAB, no rales, wheezes or rhonchi. No accessory muscle use Abd: BS+, S, mild umbilical ttp, moderately distended Psych: Age appropriate judgment and insight, normal affect and mood  Assessment and Plan: Dysphagia, unspecified type - Plan: Ambulatory referral to Gastroenterology  Abdominal bloating  Essential hypertension - Plan: lisinopril (ZESTRIL) 20 MG tablet  Refer GI. Chew carefully. PPI trial for 2 mo. F/u in 6 weeks.  The patient voiced understanding and agreement to the plan.  Jilda Roche [place], DO 01/18/23  1:40 PM

## 2023-01-29 DIAGNOSIS — M5136 Other intervertebral disc degeneration, lumbar region: Secondary | ICD-10-CM | POA: Diagnosis not present

## 2023-01-29 DIAGNOSIS — G894 Chronic pain syndrome: Secondary | ICD-10-CM | POA: Diagnosis not present

## 2023-01-29 DIAGNOSIS — M47816 Spondylosis without myelopathy or radiculopathy, lumbar region: Secondary | ICD-10-CM | POA: Diagnosis not present

## 2023-01-29 DIAGNOSIS — Z1389 Encounter for screening for other disorder: Secondary | ICD-10-CM | POA: Diagnosis not present

## 2023-02-04 DIAGNOSIS — Z419 Encounter for procedure for purposes other than remedying health state, unspecified: Secondary | ICD-10-CM | POA: Diagnosis not present

## 2023-02-22 ENCOUNTER — Ambulatory Visit (INDEPENDENT_AMBULATORY_CARE_PROVIDER_SITE_OTHER): Payer: Medicaid Other | Admitting: Family Medicine

## 2023-02-22 ENCOUNTER — Encounter: Payer: Self-pay | Admitting: Family Medicine

## 2023-02-22 ENCOUNTER — Telehealth: Payer: Self-pay

## 2023-02-22 VITALS — BP 110/72 | HR 80 | Temp 98.7°F | Ht 67.0 in | Wt 297.1 lb

## 2023-02-22 DIAGNOSIS — Z6841 Body Mass Index (BMI) 40.0 and over, adult: Secondary | ICD-10-CM

## 2023-02-22 DIAGNOSIS — K219 Gastro-esophageal reflux disease without esophagitis: Secondary | ICD-10-CM

## 2023-02-22 MED ORDER — ZEPBOUND 5 MG/0.5ML ~~LOC~~ SOAJ: 5.0000 mg | SUBCUTANEOUS | 0 refills | Status: DC

## 2023-02-22 MED ORDER — ZEPBOUND 7.5 MG/0.5ML ~~LOC~~ SOAJ: 7.5000 mg | SUBCUTANEOUS | 0 refills | Status: DC

## 2023-02-22 MED ORDER — PHENTERMINE HCL 37.5 MG PO CAPS: 37.5000 mg | ORAL_CAPSULE | ORAL | 0 refills | Status: DC

## 2023-02-22 MED ORDER — ZEPBOUND 2.5 MG/0.5ML ~~LOC~~ SOAJ: 2.5000 mg | SUBCUTANEOUS | 0 refills | Status: AC

## 2023-02-22 NOTE — Patient Instructions (Addendum)
Let me know if there are cost or supply issues.   Try to stop the Protonix after it runs out. Transition to nothing or Pepcid/famotidine 20 mg 1-2 times daily.   Keep the diet clean and stay active.  Let us know if you need anything.

## 2023-02-22 NOTE — Addendum Note (Signed)
Addended by: Radene Gunning on: 02/22/2023 02:29 PM   Modules accepted: Orders

## 2023-02-22 NOTE — Telephone Encounter (Signed)
Called informed the patient/changed appt.

## 2023-02-22 NOTE — Progress Notes (Signed)
Chief Complaint  Patient presents with   Follow-up    Subjective: Patient is a 52 y.o. male here for f/u.  Started on Protonix for abd bloating. Reports 10)% improvement. No AE's, reports compliance. Has another couple weeks before running out.   Morbid obesity: gaining weight. Diet is overall healthy. Due to chronic pain and taking care of mom, does not exercise routinely. Has never been on wt loss meds or undergone a supervised wt loss program.   Past Medical History:  Diagnosis Date   Anxiety    Arthritis    Arthritis    Cataract    Chronic kidney disease    Depression    Hypertension    Mild persistent asthma without complication 09/28/2021   OSA (obstructive sleep apnea) 06/24/2017   Plaque psoriasis     Objective: BP 110/72 (BP Location: Left Arm, Patient Position: Sitting, Cuff Size: Large)   Pulse 80   Temp 98.7 F (37.1 C) (Oral)   Ht 5\' 7"  (1.702 m)   Wt 297 lb 2 oz (134.8 kg)   SpO2 93%   BMI 46.54 kg/m  General: Awake, appears stated age Heart: RRR, no LE edema Lungs: CTAB, no rales, wheezes or rhonchi. No accessory muscle use Abd: BS+, S, NT, mild distension Psych: Age appropriate judgment and insight, normal affect and mood  Assessment and Plan: Gastroesophageal reflux disease, unspecified whether esophagitis present  Morbid obesity (HCC)  Chronic, stable. Finish Protonix after 60 d. Then transition to either nothing or Pepcid.  Chronic unstable. Counseled on diet/exercise. Start Zepbound 2.5 mg/week and steadily up titrate. F/u in 6 weeks if able to get. He will let me know about cost/supply issues.  The patient voiced understanding and agreement to the plan.  Jilda Roche [place], DO 02/22/23  11:19 AM

## 2023-02-22 NOTE — Telephone Encounter (Signed)
Pt states that insurance will not cover tizepatide and asking if there is something that can be called in. Please advise.

## 2023-02-22 NOTE — Telephone Encounter (Signed)
We can do an oral medication if his insurance does not cover injectables. He would follow up in 1 mo instead of 6 weeks. Will send. Please r/s.

## 2023-02-25 MED ORDER — PHENTERMINE HCL 37.5 MG PO CAPS: 37.5000 mg | ORAL_CAPSULE | ORAL | 0 refills | Status: DC

## 2023-02-25 NOTE — Telephone Encounter (Signed)
Resent and still printed. Will faxed to pharmacy

## 2023-02-25 NOTE — Addendum Note (Signed)
Addended by: Scharlene Gloss B on: 02/25/2023 12:37 PM   Modules accepted: Orders

## 2023-02-25 NOTE — Telephone Encounter (Signed)
Resent Phentermine. Patient made aware

## 2023-02-25 NOTE — Addendum Note (Signed)
Addended by: Scharlene Gloss B on: 02/25/2023 12:36 PM   Modules accepted: Orders

## 2023-02-25 NOTE — Telephone Encounter (Signed)
Had to call in due to fax denial Called in Phentermine 37.5 mg tablet 1 po at bedtime #30 no refills.

## 2023-02-25 NOTE — Telephone Encounter (Signed)
Patient called and states Pharmacy does not have anything on file for him.

## 2023-02-27 ENCOUNTER — Other Ambulatory Visit: Payer: Self-pay | Admitting: Family Medicine

## 2023-02-27 DIAGNOSIS — F419 Anxiety disorder, unspecified: Secondary | ICD-10-CM

## 2023-03-06 ENCOUNTER — Encounter (INDEPENDENT_AMBULATORY_CARE_PROVIDER_SITE_OTHER): Payer: Self-pay

## 2023-03-07 DIAGNOSIS — Z419 Encounter for procedure for purposes other than remedying health state, unspecified: Secondary | ICD-10-CM | POA: Diagnosis not present

## 2023-03-27 ENCOUNTER — Ambulatory Visit (INDEPENDENT_AMBULATORY_CARE_PROVIDER_SITE_OTHER): Payer: Medicaid Other | Admitting: Family Medicine

## 2023-03-27 ENCOUNTER — Encounter: Payer: Self-pay | Admitting: Family Medicine

## 2023-03-27 DIAGNOSIS — F419 Anxiety disorder, unspecified: Secondary | ICD-10-CM

## 2023-03-27 MED ORDER — BUSPIRONE HCL 10 MG PO TABS
10.0000 mg | ORAL_TABLET | Freq: Two times a day (BID) | ORAL | 2 refills | Status: AC
Start: 2023-03-27 — End: ?

## 2023-03-27 MED ORDER — PHENTERMINE HCL 37.5 MG PO CAPS: 37.5000 mg | ORAL_CAPSULE | ORAL | 0 refills | Status: DC

## 2023-03-27 MED ORDER — PHENTERMINE HCL 37.5 MG PO CAPS
37.5000 mg | ORAL_CAPSULE | ORAL | 0 refills | Status: DC
Start: 2023-03-27 — End: 2023-10-08

## 2023-03-27 NOTE — Progress Notes (Signed)
Chief Complaint  Patient presents with   Follow-up    medication    Subjective: Patient is a 52 y.o. male here for f/u.  Patient was started on Zepbound but insurance did not cover.  He was transition to phentermine 37.5 mg daily.  He reports compliance and mostly no adverse effects.  Has a L sided headache. He has lost 6 pounds in the last month.  He reports decreased cravings and appetite.  Diet is better.  He is not exercising.  Past Medical History:  Diagnosis Date   Anxiety    Arthritis    Arthritis    Cataract    Chronic kidney disease    Depression    Hypertension    Mild persistent asthma without complication 09/28/2021   OSA (obstructive sleep apnea) 06/24/2017   Plaque psoriasis     Objective: BP 116/78 (BP Location: Left Arm, Patient Position: Sitting, Cuff Size: Large)   Pulse 94   Temp 98.8 F (37.1 C) (Oral)   Ht 5\' 7"  (1.702 m)   Wt 291 lb 4 oz (132.1 kg)   SpO2 95%   BMI 45.62 kg/m  General: Awake, appears stated age Heart: RRR, no LE edema Lungs: CTAB, no rales, wheezes or rhonchi. No accessory muscle use Psych: Age appropriate judgment and insight, normal affect and mood  Assessment and Plan: Morbid obesity (HCC)  Chronic, improving. Refill 60 more d of phentermine 37.5 mg/d. Counseled on diet/exercise.  F/u in 6 mo for CPE or prn. The patient voiced understanding and agreement to the plan.  Jilda Roche [place], DO 03/27/23  10:28 AM

## 2023-03-27 NOTE — Patient Instructions (Signed)
Keep the diet clean and stay active.  Aim to do some physical exertion for 150 minutes per week. This is typically divided into 5 days per week, 30 minutes per day. The activity should be enough to get your heart rate up. Anything is better than nothing if you have time constraints.  For the swelling in your lower extremities, be sure to elevate your legs when able, mind the salt intake, stay physically active and consider wearing compression stockings.  Let us know if you need anything.

## 2023-04-02 DIAGNOSIS — M5136 Other intervertebral disc degeneration, lumbar region: Secondary | ICD-10-CM | POA: Diagnosis not present

## 2023-04-02 DIAGNOSIS — G894 Chronic pain syndrome: Secondary | ICD-10-CM | POA: Diagnosis not present

## 2023-04-02 DIAGNOSIS — Z1389 Encounter for screening for other disorder: Secondary | ICD-10-CM | POA: Diagnosis not present

## 2023-04-02 DIAGNOSIS — M47816 Spondylosis without myelopathy or radiculopathy, lumbar region: Secondary | ICD-10-CM | POA: Diagnosis not present

## 2023-04-07 DIAGNOSIS — Z419 Encounter for procedure for purposes other than remedying health state, unspecified: Secondary | ICD-10-CM | POA: Diagnosis not present

## 2023-04-12 ENCOUNTER — Ambulatory Visit: Payer: Medicaid Other | Admitting: Family Medicine

## 2023-05-01 DIAGNOSIS — M47816 Spondylosis without myelopathy or radiculopathy, lumbar region: Secondary | ICD-10-CM | POA: Diagnosis not present

## 2023-05-01 DIAGNOSIS — Z1389 Encounter for screening for other disorder: Secondary | ICD-10-CM | POA: Diagnosis not present

## 2023-05-01 DIAGNOSIS — G894 Chronic pain syndrome: Secondary | ICD-10-CM | POA: Diagnosis not present

## 2023-05-01 DIAGNOSIS — M5136 Other intervertebral disc degeneration, lumbar region: Secondary | ICD-10-CM | POA: Diagnosis not present

## 2023-05-07 DIAGNOSIS — Z419 Encounter for procedure for purposes other than remedying health state, unspecified: Secondary | ICD-10-CM | POA: Diagnosis not present

## 2023-05-07 DIAGNOSIS — L400 Psoriasis vulgaris: Secondary | ICD-10-CM | POA: Diagnosis not present

## 2023-06-04 DIAGNOSIS — M47816 Spondylosis without myelopathy or radiculopathy, lumbar region: Secondary | ICD-10-CM | POA: Diagnosis not present

## 2023-06-04 DIAGNOSIS — G894 Chronic pain syndrome: Secondary | ICD-10-CM | POA: Diagnosis not present

## 2023-06-04 DIAGNOSIS — Z1389 Encounter for screening for other disorder: Secondary | ICD-10-CM | POA: Diagnosis not present

## 2023-06-07 DIAGNOSIS — Z419 Encounter for procedure for purposes other than remedying health state, unspecified: Secondary | ICD-10-CM | POA: Diagnosis not present

## 2023-06-19 DIAGNOSIS — R9431 Abnormal electrocardiogram [ECG] [EKG]: Secondary | ICD-10-CM | POA: Diagnosis not present

## 2023-06-19 DIAGNOSIS — I10 Essential (primary) hypertension: Secondary | ICD-10-CM | POA: Diagnosis not present

## 2023-06-19 DIAGNOSIS — F17210 Nicotine dependence, cigarettes, uncomplicated: Secondary | ICD-10-CM | POA: Diagnosis not present

## 2023-06-19 DIAGNOSIS — R0781 Pleurodynia: Secondary | ICD-10-CM | POA: Diagnosis not present

## 2023-06-20 DIAGNOSIS — I10 Essential (primary) hypertension: Secondary | ICD-10-CM | POA: Diagnosis not present

## 2023-06-20 DIAGNOSIS — R9431 Abnormal electrocardiogram [ECG] [EKG]: Secondary | ICD-10-CM | POA: Diagnosis not present

## 2023-06-20 DIAGNOSIS — R0781 Pleurodynia: Secondary | ICD-10-CM | POA: Diagnosis not present

## 2023-07-07 DIAGNOSIS — Z419 Encounter for procedure for purposes other than remedying health state, unspecified: Secondary | ICD-10-CM | POA: Diagnosis not present

## 2023-07-15 DIAGNOSIS — M47816 Spondylosis without myelopathy or radiculopathy, lumbar region: Secondary | ICD-10-CM | POA: Diagnosis not present

## 2023-07-15 DIAGNOSIS — Z1389 Encounter for screening for other disorder: Secondary | ICD-10-CM | POA: Diagnosis not present

## 2023-07-15 DIAGNOSIS — G894 Chronic pain syndrome: Secondary | ICD-10-CM | POA: Diagnosis not present

## 2023-08-07 DIAGNOSIS — Z419 Encounter for procedure for purposes other than remedying health state, unspecified: Secondary | ICD-10-CM | POA: Diagnosis not present

## 2023-08-31 ENCOUNTER — Other Ambulatory Visit: Payer: Self-pay | Admitting: Family Medicine

## 2023-08-31 DIAGNOSIS — F419 Anxiety disorder, unspecified: Secondary | ICD-10-CM

## 2023-09-05 DIAGNOSIS — L400 Psoriasis vulgaris: Secondary | ICD-10-CM | POA: Diagnosis not present

## 2023-09-05 DIAGNOSIS — R531 Weakness: Secondary | ICD-10-CM | POA: Diagnosis not present

## 2023-09-07 DIAGNOSIS — Z419 Encounter for procedure for purposes other than remedying health state, unspecified: Secondary | ICD-10-CM | POA: Diagnosis not present

## 2023-09-11 DIAGNOSIS — Z1389 Encounter for screening for other disorder: Secondary | ICD-10-CM | POA: Diagnosis not present

## 2023-09-11 DIAGNOSIS — G894 Chronic pain syndrome: Secondary | ICD-10-CM | POA: Diagnosis not present

## 2023-09-11 DIAGNOSIS — M47816 Spondylosis without myelopathy or radiculopathy, lumbar region: Secondary | ICD-10-CM | POA: Diagnosis not present

## 2023-09-27 ENCOUNTER — Encounter: Payer: Medicaid Other | Admitting: Family Medicine

## 2023-10-03 DIAGNOSIS — M79604 Pain in right leg: Secondary | ICD-10-CM | POA: Diagnosis not present

## 2023-10-03 DIAGNOSIS — R1084 Generalized abdominal pain: Secondary | ICD-10-CM | POA: Diagnosis not present

## 2023-10-05 DIAGNOSIS — Z419 Encounter for procedure for purposes other than remedying health state, unspecified: Secondary | ICD-10-CM | POA: Diagnosis not present

## 2023-10-08 ENCOUNTER — Encounter: Payer: Self-pay | Admitting: Family Medicine

## 2023-10-08 ENCOUNTER — Ambulatory Visit (INDEPENDENT_AMBULATORY_CARE_PROVIDER_SITE_OTHER): Payer: Medicaid Other | Admitting: Family Medicine

## 2023-10-08 VITALS — BP 116/88 | HR 89 | Temp 97.8°F | Resp 20 | Ht 67.0 in | Wt 292.2 lb

## 2023-10-08 DIAGNOSIS — Z125 Encounter for screening for malignant neoplasm of prostate: Secondary | ICD-10-CM | POA: Diagnosis not present

## 2023-10-08 DIAGNOSIS — M79605 Pain in left leg: Secondary | ICD-10-CM | POA: Diagnosis not present

## 2023-10-08 DIAGNOSIS — H9192 Unspecified hearing loss, left ear: Secondary | ICD-10-CM

## 2023-10-08 DIAGNOSIS — M545 Low back pain, unspecified: Secondary | ICD-10-CM

## 2023-10-08 DIAGNOSIS — Z0000 Encounter for general adult medical examination without abnormal findings: Secondary | ICD-10-CM

## 2023-10-08 DIAGNOSIS — M79604 Pain in right leg: Secondary | ICD-10-CM

## 2023-10-08 DIAGNOSIS — R3129 Other microscopic hematuria: Secondary | ICD-10-CM

## 2023-10-08 LAB — URINALYSIS, MICROSCOPIC ONLY

## 2023-10-08 MED ORDER — VENLAFAXINE HCL ER 150 MG PO CP24: 150.0000 mg | ORAL_CAPSULE | Freq: Every day | ORAL | 2 refills | Status: AC

## 2023-10-08 MED ORDER — QUETIAPINE FUMARATE 25 MG PO TABS: 25.0000 mg | ORAL_TABLET | Freq: Every day | ORAL | 1 refills | Status: DC

## 2023-10-08 NOTE — Patient Instructions (Addendum)
 Give [place] 2-3 business days to get the results of your labs back.   Keep the diet clean and stay active.  Aim to do some physical exertion for 150 minutes per week. This is typically divided into 5 days per week, 30 minutes per day. The activity should be enough to get your heart rate up. Anything is better than nothing if you have time constraints.  Please get me a copy of your advanced directive form at your convenience.   The Shingrix vaccine (for shingles) is a 2 shot series spaced 2-6 months apart. It can make people feel low energy, achy and almost like they have the flu for 48 hours after injection. 1/5 people can have nausea and/or vomiting. Please plan accordingly when deciding on when to get this shot. Call your pharmacy to get this. The second shot of the series is less severe regarding the side effects, but it still lasts 48 hours.   Please consider counseling. Contact [phone] to schedule an appointment or inquire about cost/insurance coverage.  [facility] located at [address], [place], [place].  Phone number = [phone].  Dr. Regan Lemming - Adult Psychiatry.    [facility] located at [address] [place], [place], [place]. Phone number = [phone].   [facility] located at [address], [place], [place].  Phone number = [phone].   [facility] located at [address] [place], [place], [place].  Phone number = [phone].  Let us know if you need anything.

## 2023-10-08 NOTE — Progress Notes (Signed)
 Chief Complaint  Patient presents with   Annual Exam    Patient presents today for physical exam    Well Male Theador Jezewski is here for a complete physical.   His last physical was >1 year ago.  Current diet: in general, a "healthy" diet.  Current exercise: none Weight trend: stable Fatigue out of ordinary? No. Seat belt? Yes.   Advanced directive? No  Health maintenance Shingrix- No Colonoscopy- Yes Tetanus- Yes HIV- Yes Hep C- Yes   Past Medical History:  Diagnosis Date   Anxiety    Arthritis    Arthritis    Cataract    Chronic kidney disease    Depression    Hypertension    Mild persistent asthma without complication 09/28/2021   OSA (obstructive sleep apnea) 06/24/2017   Plaque psoriasis       Past Surgical History:  Procedure Laterality Date   EYE SURGERY Bilateral     Medications  Current Outpatient Medications on File Prior to Visit  Medication Sig Dispense Refill   ammonium lactate (LAC-HYDRIN) 12 % lotion APPLY TO THE AFFECTED AREAS OF SKIN BID PRN  2   busPIRone (BUSPAR) 10 MG tablet Take 1 tablet (10 mg total) by mouth 2 (two) times daily. 180 tablet 2   clonazePAM (KLONOPIN) 0.5 MG tablet TAKE 1 TABLET BY MOUTH TWICE DAILY AS NEEDED FOR ANXIETY 60 tablet 3   fluticasone (FLONASE) 50 MCG/ACT nasal spray Place 2 sprays into both nostrils daily. 48 g 2   fluticasone-salmeterol (ADVAIR) 500-50 MCG/ACT AEPB Inhale 1 puff into the lungs in the morning and at bedtime. 60 each 2   levocetirizine (XYZAL) 5 MG tablet Take 1 tablet (5 mg total) by mouth every evening. 90 tablet 2   lisinopril (ZESTRIL) 20 MG tablet Take 1 tablet (20 mg total) by mouth daily. 90 tablet 3   SKYRIZI PEN 150 MG/ML SOAJ Inject into the skin.     Spacer/Aero-Hold Chamber Mask MISC Use with albuterol. 2 each 2   Talazoparib Tosylate (TALZENNA PO) Take by mouth.      Allergies No Known Allergies  Family History Family History  Problem Relation Age of Onset   Cancer Mother     Dementia Mother    Hypertension Mother    Thyroid disease Mother    Diabetes Father    Heart disease Father    Stroke Father    Hypertension Father    Diabetes Brother    Depression Brother     Review of Systems: Constitutional:  no fevers Eye:  no recent significant change in vision Ear/Nose/Mouth/Throat:  Ears:  no hearing loss Nose/Mouth/Throat:  no complaints of nasal congestion, no sore throat Cardiovascular:  no chest pain Respiratory:  no shortness of breath Gastrointestinal:  no change in bowel habits GU:  Male: negative for dysuria, frequency Musculoskeletal/Extremities:  +chronic joint pain Integumentary (Skin/Breast):  no abnormal skin lesions reported Neurologic:  no headaches Endocrine: No unexpected weight changes Hematologic/Lymphatic:  no abnormal bleeding  Exam BP 116/88   Pulse 89   Temp 97.8 F (36.6 C)   Resp 20   Ht 5\' 7"  (1.702 m)   Wt 292 lb 3.2 oz (132.5 kg)   SpO2 97%   BMI 45.76 kg/m  General:  well developed, well nourished, in no apparent distress Skin:  no significant moles, warts, or growths Head:  no masses, lesions, or tenderness Eyes:  pupils equal and round, sclera anicteric without injection Ears:  canals without lesions, TMs shiny without  retraction, no obvious effusion, no erythema Nose:  nares patent, mucosa normal Throat/Pharynx:  lips and gingiva without lesion; tongue and uvula midline; non-inflamed pharynx; no exudates or postnasal drainage Neck: neck supple without adenopathy, thyromegaly, or masses Cardiac: RRR, no bruits, no LE edema Lungs:  clear to auscultation, breath sounds equal bilaterally, no respiratory distress Abdomen: BS+, soft, non-tender, non-distended, no masses or organomegaly noted Rectal: Deferred Musculoskeletal:  symmetrical muscle groups noted without atrophy or deformity Neuro:  gait normal; deep tendon reflexes normal and symmetric Psych: well oriented with normal range of affect and appropriate  judgment/insight  Assessment and Plan  Well adult exam - Plan: Comp Met (CMET), CBC with Differential/Platelet, Lipid panel  Microscopic hematuria - Plan: Urine drugs of abuse scrn w alc, routine ([facility]), Urine Microscopic Only  Screening for prostate cancer - Plan: PSA  Low back pain radiating to both legs - Plan: venlafaxine XR (EFFEXOR XR) 150 MG 24 hr capsule   Well 53 y.o. male. Counseled on diet and exercise. Counseled on risks and benefits of prostate cancer screening with PSA. The patient agrees to undergo testing. 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  Refer to audiology for further evaluation. Advanced directive form provided today.  Shingrix rec'd.  Immunizations, labs, and further orders as above. Follow up in 6 mo. The patient voiced understanding and agreement to the plan.  Jilda Roche [place], DO 10/08/23 1:11 PM

## 2023-10-09 ENCOUNTER — Other Ambulatory Visit: Payer: Self-pay

## 2023-10-09 ENCOUNTER — Encounter: Payer: Self-pay | Admitting: Family Medicine

## 2023-10-09 DIAGNOSIS — M47816 Spondylosis without myelopathy or radiculopathy, lumbar region: Secondary | ICD-10-CM | POA: Diagnosis not present

## 2023-10-09 DIAGNOSIS — G894 Chronic pain syndrome: Secondary | ICD-10-CM | POA: Diagnosis not present

## 2023-10-09 DIAGNOSIS — Z1389 Encounter for screening for other disorder: Secondary | ICD-10-CM | POA: Diagnosis not present

## 2023-10-09 DIAGNOSIS — E782 Mixed hyperlipidemia: Secondary | ICD-10-CM

## 2023-10-09 DIAGNOSIS — R972 Elevated prostate specific antigen [PSA]: Secondary | ICD-10-CM

## 2023-10-09 LAB — CBC WITH DIFFERENTIAL/PLATELET
Basophils Absolute: 0.1 10*3/uL (ref 0.0–0.1)
Basophils Relative: 1.1 % (ref 0.0–3.0)
Eosinophils Absolute: 0.3 10*3/uL (ref 0.0–0.7)
Eosinophils Relative: 4 % (ref 0.0–5.0)
HCT: 49.1 % (ref 39.0–52.0)
Hemoglobin: 15.9 g/dL (ref 13.0–17.0)
Lymphocytes Relative: 31.6 % (ref 12.0–46.0)
Lymphs Abs: 2.6 10*3/uL (ref 0.7–4.0)
MCHC: 32.4 g/dL (ref 30.0–36.0)
MCV: 94.2 fl (ref 78.0–100.0)
Monocytes Absolute: 0.8 10*3/uL (ref 0.1–1.0)
Monocytes Relative: 9 % (ref 3.0–12.0)
Neutro Abs: 4.5 10*3/uL (ref 1.4–7.7)
Neutrophils Relative %: 54.3 % (ref 43.0–77.0)
Platelets: 210 10*3/uL (ref 150.0–400.0)
RBC: 5.22 Mil/uL (ref 4.22–5.81)
RDW: 15.3 % (ref 11.5–15.5)
WBC: 8.3 10*3/uL (ref 4.0–10.5)

## 2023-10-09 LAB — COMPREHENSIVE METABOLIC PANEL
ALT: 24 U/L (ref 0–53)
AST: 14 U/L (ref 0–37)
Albumin: 4.2 g/dL (ref 3.5–5.2)
Alkaline Phosphatase: 93 U/L (ref 39–117)
BUN: 12 mg/dL (ref 6–23)
CO2: 28 meq/L (ref 19–32)
Calcium: 9 mg/dL (ref 8.4–10.5)
Chloride: 102 meq/L (ref 96–112)
Creatinine, Ser: 0.83 mg/dL (ref 0.40–1.50)
GFR: 102.03 mL/min (ref >60.00)
Glucose, Bld: 97 mg/dL (ref 70–99)
Potassium: 4.3 meq/L (ref 3.5–5.1)
Sodium: 139 meq/L (ref 135–145)
Total Bilirubin: 0.8 mg/dL (ref 0.2–1.2)
Total Protein: 7.3 g/dL (ref 6.0–8.3)

## 2023-10-09 LAB — LIPID PANEL
Cholesterol: 161 mg/dL (ref 0–200)
HDL: 32.4 mg/dL — ABNORMAL LOW (ref >39.00)
LDL Cholesterol: 91 mg/dL (ref 0–99)
NonHDL: 128.4
Total CHOL/HDL Ratio: 5
Triglycerides: 189 mg/dL — ABNORMAL HIGH (ref 0.0–149.0)
VLDL: 37.8 mg/dL (ref 0.0–40.0)

## 2023-10-09 LAB — PSA: PSA: 0.71 ng/mL (ref 0.10–4.00)

## 2023-10-09 NOTE — Progress Notes (Signed)
 lipid

## 2023-10-11 LAB — URINE DRUGS OF ABUSE SCREEN W ALC, ROUTINE (REF LAB)
Amphetamines, Urine: NEGATIVE ng/mL
Barbiturate Quant, Ur: NEGATIVE ng/mL
Benzodiazepine Quant, Ur: NEGATIVE ng/mL
Cannabinoid Quant, Ur: NEGATIVE ng/mL
Cocaine (Metab.): NEGATIVE ng/mL
Ethanol, Urine: NEGATIVE %
Methadone Screen, Urine: NEGATIVE ng/mL
PCP Quant, Ur: NEGATIVE ng/mL
Propoxyphene: NEGATIVE ng/mL

## 2023-10-11 LAB — OPIATES CONFIRMATION, URINE: OPIATES: NEGATIVE

## 2023-11-06 DIAGNOSIS — G894 Chronic pain syndrome: Secondary | ICD-10-CM | POA: Diagnosis not present

## 2023-11-06 DIAGNOSIS — Z1389 Encounter for screening for other disorder: Secondary | ICD-10-CM | POA: Diagnosis not present

## 2023-11-06 DIAGNOSIS — M47816 Spondylosis without myelopathy or radiculopathy, lumbar region: Secondary | ICD-10-CM | POA: Diagnosis not present

## 2023-11-12 ENCOUNTER — Ambulatory Visit: Attending: Family Medicine | Admitting: Audiologist

## 2023-11-12 DIAGNOSIS — H61893 Other specified disorders of external ear, bilateral: Secondary | ICD-10-CM | POA: Diagnosis not present

## 2023-11-12 DIAGNOSIS — H9192 Unspecified hearing loss, left ear: Secondary | ICD-10-CM | POA: Diagnosis not present

## 2023-11-12 NOTE — Procedures (Signed)
  Outpatient Audiology and [facility] [address] [place], [place]  [address] [phone]  AUDIOLOGICAL  EVALUATION  NAME: Michael Solomon     DOB:   10-31-1970      MRN: [id number]                                                                                     DATE: 11/12/2023     REFERENT: Sharlene Dory, DO STATUS: Outpatient DIAGNOSIS: Decreased Hearing Bilateral    History: Pedrohenrique was seen for an audiological evaluation due to difficulty hearing people. He is asking people to repeat more. Around four months ago he started struggling to hear from the left ear. He uses Qtips often and wonders if its wax. He uses Qtips several times a day, especially after he showers. Sometime he gets a brief ringing in each ear. Its not bothersome.  Myrick denies pain, pressure, or tinnitus. His right ear is itchy and he feels like he needs to clear it.  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  Medical history shows no additional risk for hearing loss.   Evaluation:  Otoscopy showed a clear view of the tympanic membranes, bilaterally. No cerumen. Canal skin is red and irritated.  Tympanometry results were consistent with normal middle ear function, bilaterally   Audiometric testing was completed using Conventional Audiometry techniques with insert earphones and supraural headphones. Test results are consistent with normal hearing in the right ear and a mild loss in the left ear. Speech Recognition Thresholds were obtained at 15dB HL in the right ear and at  15dB HL in the left ear. Word Recognition Testing was completed at  40dB SL and Audi scored 100% in each ear.    Results:  The test results were reviewed with Laquentin. He is drying out his ear canals with the constant use of Qtips. They are not necessary and are causing his skin to be inflamed. Normal hearing in each ear except for a very mild loss  in the left ear only. This is not a significant asymmetry. Hearing aids not necessary.  Audiogram printed and provided to [facility].    Recommendations: Annual audiometric testing recommended to monitor hearing loss for progression.  Stop using Qtips.  Lubricate the ear with a few drops of baby oil. There is no wax for him to clean out.   39 minutes spent testing and counseling on results.   If you have any questions please feel free to contact me at [phone].  Brendia Sacks [facility] Audiology Student  Nesanel Aguila Stalnaker Au.D.  Audiologist   11/12/2023  3:49 PM  Cc: Sharlene Dory, DO

## 2023-11-16 DIAGNOSIS — Z419 Encounter for procedure for purposes other than remedying health state, unspecified: Secondary | ICD-10-CM | POA: Diagnosis not present

## 2023-11-20 ENCOUNTER — Other Ambulatory Visit (INDEPENDENT_AMBULATORY_CARE_PROVIDER_SITE_OTHER)

## 2023-11-20 ENCOUNTER — Encounter: Payer: Self-pay | Admitting: Family Medicine

## 2023-11-20 DIAGNOSIS — E782 Mixed hyperlipidemia: Secondary | ICD-10-CM | POA: Diagnosis not present

## 2023-11-20 DIAGNOSIS — R972 Elevated prostate specific antigen [PSA]: Secondary | ICD-10-CM | POA: Diagnosis not present

## 2023-11-20 LAB — LIPID PANEL
Cholesterol: 171 mg/dL (ref 0–200)
HDL: 37.1 mg/dL — ABNORMAL LOW (ref >39.00)
LDL Cholesterol: 105 mg/dL — ABNORMAL HIGH (ref 0–99)
NonHDL: 133.67
Total CHOL/HDL Ratio: 5
Triglycerides: 144 mg/dL (ref 0.0–149.0)
VLDL: 28.8 mg/dL (ref 0.0–40.0)

## 2023-11-20 LAB — PSA: PSA: 0.27 ng/mL (ref 0.10–4.00)

## 2023-11-25 ENCOUNTER — Other Ambulatory Visit: Payer: Self-pay | Admitting: Family Medicine

## 2023-11-25 DIAGNOSIS — Z1152 Encounter for screening for COVID-19: Secondary | ICD-10-CM | POA: Diagnosis not present

## 2023-11-25 DIAGNOSIS — I10 Essential (primary) hypertension: Secondary | ICD-10-CM | POA: Diagnosis not present

## 2023-11-25 DIAGNOSIS — U071 COVID-19: Secondary | ICD-10-CM

## 2023-11-25 DIAGNOSIS — R9431 Abnormal electrocardiogram [ECG] [EKG]: Secondary | ICD-10-CM | POA: Diagnosis not present

## 2023-11-25 DIAGNOSIS — J189 Pneumonia, unspecified organism: Secondary | ICD-10-CM | POA: Diagnosis not present

## 2023-11-25 DIAGNOSIS — F17210 Nicotine dependence, cigarettes, uncomplicated: Secondary | ICD-10-CM | POA: Diagnosis not present

## 2023-12-06 ENCOUNTER — Encounter: Payer: Self-pay | Admitting: Family Medicine

## 2023-12-06 ENCOUNTER — Ambulatory Visit: Admitting: Family Medicine

## 2023-12-06 VITALS — BP 116/70 | HR 91 | Temp 98.0°F | Resp 16 | Ht 67.0 in | Wt 280.6 lb

## 2023-12-06 DIAGNOSIS — R0789 Other chest pain: Secondary | ICD-10-CM | POA: Diagnosis not present

## 2023-12-06 NOTE — Patient Instructions (Addendum)
 Ice/cold pack over area for 10-15 min twice daily.  Heat (pad or rice pillow in microwave) over affected area, 10-15 minutes twice daily.   OK to take Tylenol  1000 mg (2 extra strength tabs) or 975 mg (3 regular strength tabs) every 6 hours as needed.  The Shingrix vaccine (for shingles) is a 2 shot series spaced 2-6 months apart. It can make people feel low energy, achy and almost like they have the flu for 48 hours after injection. 1/5 people can have nausea and/or vomiting. Please plan accordingly when deciding on when to get this shot. Call your pharmacy appointment to get this. The second shot of the series is less severe regarding the side effects, but it still lasts 48 hours.    Please consider getting your pneumonia vaccine. Try the pharmacy first but we may have to try the health department.   Let us  know if you need anything.  Pectoralis Major Rehab Ask your health care provider which exercises are safe for you. Do exercises exactly as told by your health care provider and adjust them as directed. It is normal to feel mild stretching, pulling, tightness, or discomfort as you do these exercises, but you should stop right away if you feel sudden pain or your pain gets worse. Do not begin these exercises until told by your health care provider. Stretching and range of motion exercises These exercises warm up your muscles and joints and improve the movement and flexibility of your shoulder. These exercises can also help to relieve pain, numbness, and tingling. Exercise A: Pendulum  Stand near a wall or a surface that you can hold onto for balance. Bend at the waist and let your left / right arm hang straight down. Use your other arm to keep your balance. Relax your arm and shoulder muscles, and move your hips and your trunk so your left / right arm swings freely. Your arm should swing because of the motion of your body, not because you are using your arm or shoulder muscles. Keep moving so  your arm swings in the following directions, as told by your health care provider: Side to side. Forward and backward. In clockwise and counterclockwise circles. Slowly return to the starting position. Repeat 2 times. Complete this exercise 3 times per week. Exercise B: Abduction, standing Stand and hold a broomstick, a cane, or a similar object. Place your hands a little more than shoulder-width apart on the object. Your left / right hand should be palm-up, and your other hand should be palm-down. While keeping your elbow straight and your shoulder muscles relaxed, push the stick across your body toward your left / right side. Raise your left / right arm to the side of your body and then over your head until you feel a stretch in your shoulder. Stop when you reach the angle that is recommended by your health care provider. Avoid shrugging your shoulder while you raise your arm. Keep your shoulder blade tucked down toward the middle of your spine. Hold for 10 seconds. Slowly return to the starting position. Repeat 2 times. Complete this exercise 3 times per week. Exercise C: Wand flexion, supine  Lie on your back. You may bend your knees for comfort. Hold a broomstick, a cane, or a similar object so that your hands are about shoulder-width apart on the object. Your palms should face toward your feet. Raise your left / right arm in front of your face, then behind your head (toward the floor). Use  your other hand to help you do this. Stop when you feel a gentle stretch in your shoulder, or when you reach the angle that is recommended by your health care provider. Hold for 3 seconds. Use the broomstick and your other arm to help you return your left / right arm to the starting position. Repeat 2 times. Complete this exercise 3 times per week. Exercise D: Wand shoulder external rotation Stand and hold a broomstick, a cane, or a similar object so your hands are about shoulder-width apart on the  object. Start with your arms hanging down, then bend both elbows to an "L" shape (90 degrees). Keep your left / right elbow at your side. Use your other hand to push the stick so your left / right forearm moves away from your body, out to your side. Keep your left / right elbow bent to 90 degrees and keep it against your side. Stop when you feel a gentle stretch in your shoulder, or when you reach the angle recommended by your health care provider. Hold for 10 seconds. Use the stick to help you return your left / right arm to the starting position. Repeat 2 times. Complete this exercise 3 times per week. Strengthening exercises These exercises build strength and endurance in your shoulder. Endurance is the ability to use your muscles for a long time, even after your muscles get tired. Exercise E: Scapular protraction, standing Stand so you are facing a wall. Place your feet about one arm-length away from the wall. Place your hands on the wall and straighten your elbows. Keep your hands on the wall as you push your upper back away from the wall. You should feel your shoulder blades sliding forward. Keep your elbows and your head still. If you are not sure that you are doing this exercise correctly, ask your health care provider for more instructions. Hold for 3 seconds. Slowly return to the starting position. Let your muscles relax completely before you repeat this exercise. Repeat 2 times. Complete this exercise 3 times per week. Exercise F: Shoulder blade squeezes  (scapular retraction) Sit with good posture in a stable chair. Do not let your back touch the back of the chair. Your arms should be at your sides with your elbows bent. You may rest your forearms on a pillow if that is more comfortable. Squeeze your shoulder blades together. Bring them down and back. Keep your shoulders level. Do not lift your shoulders up toward your ears. Hold for 3 seconds. Return to the starting  position. Repeat 2 times. Complete this exercise 3 times per week. This information is not intended to replace advice given to you by your health care provider. Make sure you discuss any questions you have with your health care provider. Document Released: 07/23/2005 Document Revised: 05/03/2016 Document Reviewed: 04/10/2015 Elsevier Interactive Patient Education  [employer].

## 2023-12-06 NOTE — Progress Notes (Signed)
 Chief Complaint  Patient presents with   Follow-up    Follow up    Subjective: Patient is a 53 y.o. male here for ER follow-up.  Patient was in the emergency department for left-sided chest pain and shortness of breath around 1 week ago.  X-ray showed a right-sided pneumonia.  He was placed on an antibiotic and a cough suppressant.  He has completed the course.  Coughing is much better in addition to breathing and chest pain.  No current shortness of breath, fevers, upper respiratory infections otherwise.  Past Medical History:  Diagnosis Date   Anxiety    Arthritis    Arthritis    Cataract    Chronic kidney disease    Depression    Hypertension    Mild persistent asthma without complication 09/28/2021   OSA (obstructive sleep apnea) 06/24/2017   Plaque psoriasis     Objective: BP 116/70 (BP Location: Left Arm, Patient Position: Sitting)   Pulse 91   Temp 98 F (36.7 C) (Oral)   Resp 16   Ht 5\' 7"  (1.702 m)   Wt 280 lb 9.6 oz (127.3 kg)   SpO2 98%   BMI 43.95 kg/m  General: Awake, appears stated age Heart: RRR, no LE edema Lungs: CTAB, no rales, wheezes or rhonchi. No accessory muscle use Psych: Age appropriate judgment and insight, normal affect and mood  Assessment and Plan: Chest wall pain  Pneumonia seems like it is resolving.  Continue current care.  Tylenol , heat, ice, stretches and exercises for the chest wall pain from the coughing.  Follow-up as originally scheduled. The patient voiced understanding and agreement to the plan.  Shellie Dials [place], DO 12/06/23  11:45 AM

## 2023-12-12 DIAGNOSIS — M5136 Other intervertebral disc degeneration, lumbar region with discogenic back pain only: Secondary | ICD-10-CM | POA: Diagnosis not present

## 2023-12-12 DIAGNOSIS — Z79891 Long term (current) use of opiate analgesic: Secondary | ICD-10-CM | POA: Diagnosis not present

## 2023-12-12 DIAGNOSIS — G894 Chronic pain syndrome: Secondary | ICD-10-CM | POA: Diagnosis not present

## 2023-12-16 DIAGNOSIS — Z419 Encounter for procedure for purposes other than remedying health state, unspecified: Secondary | ICD-10-CM | POA: Diagnosis not present

## 2024-01-16 DIAGNOSIS — Z419 Encounter for procedure for purposes other than remedying health state, unspecified: Secondary | ICD-10-CM | POA: Diagnosis not present

## 2024-01-16 DIAGNOSIS — M47816 Spondylosis without myelopathy or radiculopathy, lumbar region: Secondary | ICD-10-CM | POA: Diagnosis not present

## 2024-01-21 ENCOUNTER — Telehealth: Payer: Self-pay

## 2024-01-21 ENCOUNTER — Encounter: Payer: Self-pay | Admitting: Family Medicine

## 2024-01-21 ENCOUNTER — Ambulatory Visit: Admitting: Family Medicine

## 2024-01-21 VITALS — BP 120/78 | HR 84 | Temp 98.0°F | Resp 16 | Ht 67.0 in | Wt 272.6 lb

## 2024-01-21 DIAGNOSIS — R350 Frequency of micturition: Secondary | ICD-10-CM | POA: Diagnosis not present

## 2024-01-21 DIAGNOSIS — R300 Dysuria: Secondary | ICD-10-CM | POA: Diagnosis not present

## 2024-01-21 LAB — POCT URINALYSIS DIPSTICK
Bilirubin, UA: NEGATIVE
Blood, UA: NEGATIVE
Glucose, UA: NEGATIVE
Leukocytes, UA: NEGATIVE
Nitrite, UA: NEGATIVE
Protein, UA: NEGATIVE
Spec Grav, UA: 1.01 (ref 1.010–1.025)
Urobilinogen, UA: 0.2 U/dL
pH, UA: 6 (ref 5.0–8.0)

## 2024-01-21 MED ORDER — SULFAMETHOXAZOLE-TRIMETHOPRIM 800-160 MG PO TABS: 1.0000 | ORAL_TABLET | Freq: Two times a day (BID) | ORAL | 0 refills | Status: AC

## 2024-01-21 MED ORDER — TADALAFIL 20 MG PO TABS: 10.0000 mg | ORAL_TABLET | ORAL | 3 refills | Status: AC | PRN

## 2024-01-21 NOTE — Progress Notes (Signed)
 Chief Complaint  Patient presents with   Dysuria    Dysuria     Michael Solomon is a 53 y.o. male here for possible UTI.  Duration: 2 weeks. Symptoms: Dysuria, urinary frequency, urinary hesitancy, and urgency Denies: hematuria, urinary retention, fever, nausea, vomiting, flank pain, discharge Hx of recurrent UTI? No Not circumcised.  Denies new sexual partners.  Past Medical History:  Diagnosis Date   Anxiety    Arthritis    Arthritis    Cataract    Chronic kidney disease    Depression    Hypertension    Mild persistent asthma without complication 09/28/2021   OSA (obstructive sleep apnea) 06/24/2017   Plaque psoriasis      BP 120/78 (BP Location: Left Arm, Patient Position: Sitting)   Pulse 84   Temp 98 F (36.7 C) (Oral)   Resp 16   Ht 5' 7 (1.702 m)   Wt 272 lb 9.6 oz (123.7 kg)   SpO2 96%   BMI 42.70 kg/m  General: Awake, alert, appears stated age Heart: RRR Lungs: CTAB, normal respiratory effort, no accessory muscle usage Abd: BS+, soft, NT, ND, no masses or organomegaly MSK: No CVA tenderness, neg Lloyd's sign Psych: Age appropriate judgment and insight  Dysuria - Plan: sulfamethoxazole-trimethoprim (BACTRIM DS) 800-160 MG tablet  Urine frequency - Plan: POCT Urinalysis Dipstick, Urine Culture, sulfamethoxazole-trimethoprim (BACTRIM DS) 800-160 MG tablet  Stay hydrated. Ua neg. Ck cx. Will empirically tx.  Seek immediate care if pt starts to develop fevers, new/worsening symptoms, uncontrollable N/V. F/u prn. The patient voiced understanding and agreement to the plan.  Shellie Dials [place], DO 01/21/24 11:22 AM

## 2024-01-21 NOTE — Telephone Encounter (Signed)
 PA

## 2024-01-21 NOTE — Telephone Encounter (Signed)
 Called pt lvm letting him know he should be able to ask pharmacy about GoodRx.

## 2024-01-21 NOTE — Patient Instructions (Signed)
 Stay hydrated.   Warning signs/symptoms: Uncontrollable nausea/vomiting, fevers, worsening symptoms despite treatment, confusion.  Give [place] around 2 business days to get culture back to you.  Let us know if you need anything.

## 2024-01-22 ENCOUNTER — Other Ambulatory Visit (HOSPITAL_COMMUNITY): Payer: Self-pay

## 2024-01-22 ENCOUNTER — Telehealth: Payer: Self-pay

## 2024-01-22 NOTE — Telephone Encounter (Signed)
 Called pt and sent mychart message letting him know about GoodRx.

## 2024-01-22 NOTE — Telephone Encounter (Signed)
  I looked up the GoodRx card for the patient and used the zipcode to his pharmacy. The copay should be $24.92 for 30 tabs and he can use the card info listed here.

## 2024-01-22 NOTE — Telephone Encounter (Signed)
 Pharmacy Patient Advocate Encounter   Received notification from Pt Calls Messages that prior authorization for Tadalafil  20mg  tabs is required/requested.   Insurance verification completed.   The patient is insured through [facility] [place] .   Per test claim: Product/service not covered.   Patient was advised to try a GoodRx card for a discount. Prior authorization not submitted due to medicaid will not pay for tadalafil .

## 2024-01-23 ENCOUNTER — Ambulatory Visit: Payer: Self-pay | Admitting: Family Medicine

## 2024-01-23 LAB — URINE CULTURE
MICRO NUMBER:: 16590309
SPECIMEN QUALITY:: ADEQUATE

## 2024-02-05 DIAGNOSIS — K5792 Diverticulitis of intestine, part unspecified, without perforation or abscess without bleeding: Secondary | ICD-10-CM | POA: Diagnosis not present

## 2024-02-05 DIAGNOSIS — R109 Unspecified abdominal pain: Secondary | ICD-10-CM | POA: Diagnosis not present

## 2024-02-10 ENCOUNTER — Other Ambulatory Visit: Payer: Self-pay | Admitting: Family Medicine

## 2024-02-15 DIAGNOSIS — Z419 Encounter for procedure for purposes other than remedying health state, unspecified: Secondary | ICD-10-CM | POA: Diagnosis not present

## 2024-02-19 DIAGNOSIS — M47816 Spondylosis without myelopathy or radiculopathy, lumbar region: Secondary | ICD-10-CM | POA: Diagnosis not present

## 2024-03-12 ENCOUNTER — Telehealth: Payer: Self-pay | Admitting: Family Medicine

## 2024-03-12 NOTE — Telephone Encounter (Signed)
 Copied from CRM [id number]. Topic: General - Other >> Mar 11, 2024  3:34 PM Michael Solomon wrote: Reason for CRM: The patient is calling in because he is having a hard time receiving food stamps. He is wondering if Dr. Alfonso could write a letter stating that he is no longer able to work at the moment due to his back issue.

## 2024-03-13 ENCOUNTER — Other Ambulatory Visit: Payer: Self-pay

## 2024-03-13 DIAGNOSIS — T730XXA Starvation, initial encounter: Secondary | ICD-10-CM

## 2024-03-13 NOTE — Telephone Encounter (Signed)
 Can we get him in with our social work team to help out with this plz? Such a letter should come from his pain doc. Thx.

## 2024-03-13 NOTE — Telephone Encounter (Signed)
 Called pt was advised Sales executive and Social work, will reach out to discuss.

## 2024-03-17 DIAGNOSIS — Z419 Encounter for procedure for purposes other than remedying health state, unspecified: Secondary | ICD-10-CM | POA: Diagnosis not present

## 2024-03-24 ENCOUNTER — Other Ambulatory Visit: Payer: Self-pay | Admitting: Family Medicine

## 2024-03-24 DIAGNOSIS — I10 Essential (primary) hypertension: Secondary | ICD-10-CM

## 2024-03-25 DIAGNOSIS — G894 Chronic pain syndrome: Secondary | ICD-10-CM | POA: Diagnosis not present

## 2024-03-25 DIAGNOSIS — Z79891 Long term (current) use of opiate analgesic: Secondary | ICD-10-CM | POA: Diagnosis not present

## 2024-03-25 DIAGNOSIS — M47816 Spondylosis without myelopathy or radiculopathy, lumbar region: Secondary | ICD-10-CM | POA: Diagnosis not present

## 2024-03-27 NOTE — Telephone Encounter (Signed)
 Copied from CRM [id number]. Topic: Clinical - Medical Advice >> Mar 27, 2024  9:39 AM Chasity T wrote: Reason for CRM: christal from [employer] Patient expressed if they can write a letter to help get his food stamps back. She advised that they don't write those letters there and to speak with PCP about getting assistance of getting it done. If you need to contact her for further questions its [phone] ext [phone]. She will be out of the office at 12 pm today but be back on Monday.  Pt was advised per he dont do letter for food stamp, we put referral in and advised the pt referral as been placed.

## 2024-04-08 ENCOUNTER — Telehealth: Payer: Self-pay

## 2024-04-08 NOTE — Progress Notes (Signed)
 Complex Care Management Note  Care Guide Note 04/08/2024 Name: Michael Solomon MRN: [id number] DOB: 1971-01-23  Michael Solomon is a 53 y.o. year old male who sees Frann, Mabel Mt, DO for primary care. I reached out to Danniel Dee by phone today to offer complex care management services.  [name] was given information about Complex Care Management services today including:   The Complex Care Management services include support from the care team which includes your Nurse Care Manager, Clinical Social Worker, or Pharmacist.  The Complex Care Management team is here to help remove barriers to the health concerns and goals most important to you. Complex Care Management services are voluntary, and the patient may decline or stop services at any time by request to their care team member.   Complex Care Management Consent Status: Patient agreed to services and verbal consent obtained.   Follow up plan:  Telephone appointment with complex care management team member scheduled for:  BSW 04/13/2024 and RNCM 04/16/2024  Encounter Outcome:  Patient Scheduled  Jeoffrey [place] , RMA     [place]  [facility], [facility] Guide  Direct Dial: [phone]  Website: [email or web address]

## 2024-04-10 ENCOUNTER — Encounter: Payer: Self-pay | Admitting: Family Medicine

## 2024-04-10 ENCOUNTER — Ambulatory Visit: Admitting: Family Medicine

## 2024-04-10 VITALS — BP 102/80 | HR 76 | Resp 18 | Ht 67.0 in | Wt 256.0 lb

## 2024-04-10 DIAGNOSIS — F419 Anxiety disorder, unspecified: Secondary | ICD-10-CM

## 2024-04-10 DIAGNOSIS — I10 Essential (primary) hypertension: Secondary | ICD-10-CM | POA: Diagnosis not present

## 2024-04-10 MED ORDER — QUETIAPINE FUMARATE 50 MG PO TABS
50.0000 mg | ORAL_TABLET | Freq: Every day | ORAL | 1 refills | Status: DC
Start: 2024-04-10 — End: 2024-04-28

## 2024-04-10 NOTE — Patient Instructions (Addendum)
 Take 2 tabs of your Seroquel  nightly until you run out. A higher dosage has been sent to the pharmacy.   Keep the diet clean and stay active.  Ice/cold pack over area for 10-15 min twice daily.  OK to take Tylenol  1000 mg (2 extra strength tabs) or 975 mg (3 regular strength tabs) every 6 hours as needed.  Stretch the area daily.   Let us  know if you need anything.

## 2024-04-10 NOTE — Progress Notes (Signed)
 Chief Complaint  Patient presents with   Follow-up    6 month    Subjective Michael Solomon is a 53 y.o. male who presents for hypertension follow up. He does not monitor home blood pressures. He is compliant with medication- lisinopril  20 mg/d. Patient has these side effects of medication: none He is sometimes adhering to a healthy diet overall. Current exercise: none No CP or SOB.   GAD Taking Seroquel  25 mg/d, Effexor  XR 150 mg/d, BuSpar  10 mg bid, Klonopin  prn. Not seeing a therapist.  No homicidal or suicidal ideation.  No self-medication.  He has chronic pain and is seeing a pain doctor.  He needs a letter filled out stating he cannot work.  We do not do this and his pain management doctor is not doing this either.  He has hired a Clinical research associate and needs a formal evaluation.  He has not been scheduled until late October for this.  His mother is also not with great health.  He is service stressors affecting his mood.  He does not sleep well.   Past Medical History:  Diagnosis Date   Anxiety    Arthritis    Arthritis    Cataract    Chronic kidney disease    Depression    Hypertension    Mild persistent asthma without complication 09/28/2021   OSA (obstructive sleep apnea) 06/24/2017   Plaque psoriasis     Exam BP 102/80   Pulse 76   Resp 18   Ht 5' 7 (1.702 m)   Wt 256 lb (116.1 kg)   SpO2 98%   BMI 40.10 kg/m  General:  well developed, well nourished, in no apparent distress Heart: RRR, no bruits, no LE edema Lungs: clear to auscultation, no accessory muscle use Psych: well oriented with normal range of affect and appropriate judgment/insight  Essential hypertension  Anxiety - Plan: QUEtiapine  (SEROQUEL ) 50 MG tablet  Chronic, stable.  Continue lisinopril  20 mg daily.  Counseled on diet and exercise.  He will monitor blood pressure at home and let me know if it starts dropping. Chronic, not controlled.  Consider getting a therapist.  Increase quetiapine  from 25 mg  nightly to 50 mg nightly.  Continue Klonopin  as needed, Effexor  XR 150 mg daily, BuSpar  10 mg twice daily. F/u in 1 mo. The patient voiced understanding and agreement to the plan.  Mabel Mt [place], DO 04/10/24  11:26 AM

## 2024-04-13 ENCOUNTER — Other Ambulatory Visit: Payer: Self-pay | Admitting: Licensed Clinical Social Worker

## 2024-04-13 NOTE — Patient Instructions (Signed)
 Visit Information  Thank you for taking time to visit with me today. Please don't hesitate to contact me if I can be of assistance to you before our next scheduled appointment.  Our next appointment is by telephone on 05/04/2024 at 10:30am Please call the care guide team at [phone] if you need to cancel or reschedule your appointment.   Following is a copy of your care plan:   Goals Addressed             This Visit's Progress    BSW VBCI Social Work Care Plan       Problems:   Food Insecurity   CSW Clinical Goal(s):   Over the next 3 weeks the Patient will explore community resource options for unmet needs related to [employer] .  Interventions:  SW will mail out the food pantry list and educated the patient on the GGFF app  Patient Goals/Self-Care Activities:  Follow up with mailed food pantry  for community food options.  Plan:   Telephone follow up appointment with care management team member scheduled for:  05/04/2024 at 10:30 am        Please call the Suicide and Crisis Lifeline: 988 go to [facility] Urgent [facility] [address] [place] [phone]) call 911 if you are experiencing a Mental Health or Behavioral Health Crisis or need someone to talk to.  Patient verbalizes understanding of instructions and care plan provided today and agrees to view in MyChart. Active MyChart status and patient understanding of how to access instructions and care plan via MyChart confirmed with patient.     Tobias CHARM Maranda HEDWIG, PhD [facility], [facility] Social Worker Direct Dial: [phone]  Fax: [phone]

## 2024-04-13 NOTE — Patient Outreach (Signed)
 Complex Care Management   Visit Note  04/13/2024  Name:  Michael Solomon MRN: [id number] DOB: 10-Sep-1970  Situation: Referral received for Complex Care Management related to SDOH Barriers:  Food insecurity I obtained verbal consent from Patient.  Visit completed with Patient  on the phone  Background:   Past Medical History:  Diagnosis Date   Anxiety    Arthritis    Arthritis    Cataract    Chronic kidney disease    Depression    Hypertension    Mild persistent asthma without complication 09/28/2021   OSA (obstructive sleep apnea) 06/24/2017   Plaque psoriasis     Assessment:Patient is currently living with his aunt and uncle because he stated that his mother , father and brother passed over the past few years and he did not have anyone else. He has been there for about a month. He stated that he is sad and cries a lot, the SW offered to schedule with an LCSW but the patient declined. The Patient has an initial appointment with the [facility] on 04/16/2024 at 9:00 am and reminded the patient of that. SW will mail out the food pantry lisy and educated the patient about the GGFF App. SW will also mail out the food stamp application    SDOH Interventions    Flowsheet Row Patient Outreach Telephone from 04/13/2024 in [place] [facility] Office Visit from 09/05/2022 in [facility]  SDOH Interventions    Food Insecurity Interventions Community Resources Provided  [SW will mail out list] --  Housing Interventions Intervention Not Indicated --  Transportation Interventions Intervention Not Indicated --  Utilities Interventions Intervention Not Indicated --  Depression Interventions/Treatment  -- Medication      Recommendation:   none  Follow Up Plan:   Telephone follow up appointment date/time:  05/04/2024 at 10:30 am  Tobias CHARM Maranda HEDWIG, PhD [facility], [facility] Social Worker Direct  Dial: [phone]  Fax: [phone]

## 2024-04-16 ENCOUNTER — Encounter: Payer: Self-pay | Admitting: *Deleted

## 2024-04-16 ENCOUNTER — Telehealth: Payer: Self-pay | Admitting: *Deleted

## 2024-04-16 NOTE — Patient Instructions (Signed)
 Michael Solomon - Lo siento, no pude comunicarme con usted hoy para nuestra cita programada a las 9 a.m. Trabajo con el Dr. Frann, Michael Solomon, y estoy llamando para apoyar sus necesidades de atencin mdica. Por favor, comunquese conmigo al [phone] a su conveniencia. Espero poder hablar con usted pronto.  Michael Solomon BECKER Ramonita, RN, BSN, CCM [facility]  [facility] [place], [facility] [place] directo: [phone]  Fax: [phone]

## 2024-04-17 DIAGNOSIS — Z419 Encounter for procedure for purposes other than remedying health state, unspecified: Secondary | ICD-10-CM | POA: Diagnosis not present

## 2024-04-26 DIAGNOSIS — R1011 Right upper quadrant pain: Secondary | ICD-10-CM | POA: Diagnosis not present

## 2024-04-26 DIAGNOSIS — R9431 Abnormal electrocardiogram [ECG] [EKG]: Secondary | ICD-10-CM | POA: Diagnosis not present

## 2024-04-28 ENCOUNTER — Encounter: Payer: Self-pay | Admitting: Family Medicine

## 2024-04-28 ENCOUNTER — Ambulatory Visit: Admitting: Family Medicine

## 2024-04-28 VITALS — BP 128/88 | HR 77 | Resp 18 | Ht 67.0 in | Wt 255.8 lb

## 2024-04-28 DIAGNOSIS — L400 Psoriasis vulgaris: Secondary | ICD-10-CM | POA: Diagnosis not present

## 2024-04-28 DIAGNOSIS — G8929 Other chronic pain: Secondary | ICD-10-CM

## 2024-04-28 DIAGNOSIS — M545 Low back pain, unspecified: Secondary | ICD-10-CM | POA: Diagnosis not present

## 2024-04-28 DIAGNOSIS — M25561 Pain in right knee: Secondary | ICD-10-CM

## 2024-04-28 DIAGNOSIS — F419 Anxiety disorder, unspecified: Secondary | ICD-10-CM

## 2024-04-28 DIAGNOSIS — M25562 Pain in left knee: Secondary | ICD-10-CM | POA: Diagnosis not present

## 2024-04-28 MED ORDER — QUETIAPINE FUMARATE 50 MG PO TABS: 50.0000 mg | ORAL_TABLET | Freq: Every day | ORAL | Status: DC

## 2024-04-28 MED ORDER — QUETIAPINE FUMARATE 50 MG PO TABS: 50.0000 mg | ORAL_TABLET | Freq: Every day | ORAL | 1 refills | Status: DC

## 2024-04-28 NOTE — Progress Notes (Signed)
 Chief Complaint  Patient presents with   Pain    Subjective: Patient is a 53 y.o. male here for pain in his thighs.  Happens recurrently. No recent inj or change in activity. He does not exercise because of this. Sees the pain clinic. Has associated knee pain. Hx of chronic jt pain and psoriasis.   Past Medical History:  Diagnosis Date   Anxiety    Arthritis    Arthritis    Cataract    Chronic kidney disease    Depression    Hypertension    Mild persistent asthma without complication 09/28/2021   OSA (obstructive sleep apnea) 06/24/2017   Plaque psoriasis     Objective: BP 128/88   Pulse 77   Resp 18   Ht 5' 7 (1.702 m)   Wt 255 lb 12.8 oz (116 kg)   SpO2 98%   BMI 40.06 kg/m  General: Awake, appears stated age MSK: TTP in lumbar parasp msc b/l; very poor hamstring ROM.  Skin: No rashes on exposed skin Lungs: No accessory muscle use Psych: Age appropriate judgment and insight, normal affect and mood  Assessment and Plan: Chronic bilateral low back pain without sciatica - Plan: Ambulatory referral to Physical Therapy  Plaque psoriasis  Chronic pain of both knees - Plan: Ambulatory referral to Orthopedic Surgery  Anxiety - Plan: QUEtiapine  (SEROQUEL ) 50 MG tablet, DISCONTINUED: QUEtiapine  (SEROQUEL ) 50 MG tablet  Refer PT.  Consider Rheum referral pending ortho eval.  Refer ortho.  Has not picked this up yet, will cancel original appt, sched in 1 mo.  The patient voiced understanding and agreement to the plan.  Mabel Mt [place], DO 04/28/24  1:44 PM

## 2024-04-28 NOTE — Patient Instructions (Addendum)

## 2024-05-01 ENCOUNTER — Other Ambulatory Visit (HOSPITAL_COMMUNITY): Payer: Self-pay

## 2024-05-01 ENCOUNTER — Telehealth: Payer: Self-pay

## 2024-05-01 DIAGNOSIS — F419 Anxiety disorder, unspecified: Secondary | ICD-10-CM

## 2024-05-01 MED ORDER — QUETIAPINE FUMARATE 50 MG PO TABS: 50.0000 mg | ORAL_TABLET | Freq: Every day | ORAL | 1 refills | Status: DC

## 2024-05-01 NOTE — Telephone Encounter (Signed)
 Pharmacy Patient Advocate Encounter   Received notification from Pt Calls Messages that prior authorization for Quetiapine  50mg  tabs is required/requested.   Insurance verification completed.   The patient is insured through [facility] [employer] .   Per test claim: PA required; PA submitted to above mentioned insurance via Latent Key/confirmation #/EOC [id number] Status is pending

## 2024-05-01 NOTE — Telephone Encounter (Signed)
 Pharmacy Patient Advocate Encounter  Received notification from [facility] MEDICAID that Prior Authorization for Quetiapine  50mg  tabs has been 05/01/25   PA #/Case ID/Reference #: [id number]

## 2024-05-01 NOTE — Telephone Encounter (Signed)
 Can you check and see if Seroquel  will need a PA please?

## 2024-05-01 NOTE — Telephone Encounter (Signed)
 Copied from CRM [id number]. Topic: Clinical - Medication Prior Auth >> May 01, 2024 12:03 PM Mia F wrote: Reason for CRM: Pt says the pharmacy says the sent over a pa for QUEtiapine  (SEROQUEL ) 50 MG tablet to be completed. Pt is almost out of his medication and says he has been waiting for two weeks. He says he has two pills left

## 2024-05-04 ENCOUNTER — Telehealth: Payer: Self-pay

## 2024-05-04 ENCOUNTER — Other Ambulatory Visit: Admitting: Licensed Clinical Social Worker

## 2024-05-04 NOTE — Telephone Encounter (Signed)
 Any update on PA QUEtiapine  (SEROQUEL ) 50 MG tablet  Copied from CRM #[id number]. Topic: Clinical - Medication Question >> May 04, 2024 11:36 AM Carlyon D wrote: Reason for CRM: Pt is calling regards to medication  QUEtiapine  (SEROQUEL ) 50 MG tablet. Pt states he has been calling since last week and is out of his medication. Pt would like some one to reach out to him

## 2024-05-05 ENCOUNTER — Telehealth: Payer: Self-pay

## 2024-05-05 ENCOUNTER — Other Ambulatory Visit (HOSPITAL_COMMUNITY): Payer: Self-pay

## 2024-05-05 ENCOUNTER — Other Ambulatory Visit: Payer: Self-pay

## 2024-05-05 DIAGNOSIS — G8929 Other chronic pain: Secondary | ICD-10-CM

## 2024-05-05 NOTE — Telephone Encounter (Signed)
 Called [facility] pharmacy to notify of the approval for the Quetiapine  50mg  tabs.

## 2024-05-05 NOTE — Telephone Encounter (Signed)
 Sent pt message letting him know medication was approved.

## 2024-05-05 NOTE — Telephone Encounter (Signed)
 Referral has been changed to [facility].

## 2024-05-06 ENCOUNTER — Telehealth: Payer: Self-pay

## 2024-05-06 DIAGNOSIS — L400 Psoriasis vulgaris: Secondary | ICD-10-CM | POA: Diagnosis not present

## 2024-05-06 NOTE — Progress Notes (Signed)
 Complex Care Management Care Guide Note  05/06/2024 Name: Michael Solomon MRN: [id number] DOB: May 02, 1971  Michael Solomon is a 53 y.o. year old male who is a primary care patient of Frann, Mabel Mt, DO and is actively engaged with the care management team. I reached out to Danniel Dee by phone today to assist with re-scheduling  with the RN Case Manager.  Follow up plan: Telephone appointment with complex care management team member scheduled for:  05/07/24 at 9:00 a.m.   Dream[facility]  [facility], [facility] VBCI Assistant Direct Dial: [phone]  Fax: [phone]

## 2024-05-07 ENCOUNTER — Other Ambulatory Visit: Payer: Self-pay

## 2024-05-07 ENCOUNTER — Other Ambulatory Visit: Payer: Self-pay | Admitting: *Deleted

## 2024-05-07 DIAGNOSIS — M47817 Spondylosis without myelopathy or radiculopathy, lumbosacral region: Secondary | ICD-10-CM | POA: Insufficient documentation

## 2024-05-07 DIAGNOSIS — M5136 Other intervertebral disc degeneration, lumbar region with discogenic back pain only: Secondary | ICD-10-CM | POA: Insufficient documentation

## 2024-05-07 DIAGNOSIS — Z79891 Long term (current) use of opiate analgesic: Secondary | ICD-10-CM | POA: Insufficient documentation

## 2024-05-07 NOTE — Patient Outreach (Signed)
 Complex Care Management   Visit Note  05/14/2024 update for 05/07/24  Name:  Michael Solomon MRN: [id number] DOB: October 14, 1970  Situation: Referral received for Complex Care Management related to SDOH Barriers:  Food insecurity to SW * RN CM. I obtained verbal consent from Patient.  Visit completed with Patient  on the phone  Food insecurity- He confirms he received   Chronic low medial Back pain that radiates to left & right sides  - level 8 today more neuropathic & muscle pain Also chronic pain of the knees pending referral to orthopedic surgery [facility] on 05/05/24 Denies fall, accident He is followed by   Hypotension- episodes [place] bout medicines  Respiratory Uses the fan Aware that dust is trigger neck fan     Michael Solomon is being followed by social work (SW)   He voices not working well with computers but needed clarification of his my chart username so his family could assist with resetting his password with him  He voiced understanding of future change in RN CM to [employer] and appreciation of this RN CM services rendered  Background:   Past Medical History:  Diagnosis Date   Anxiety    Arthritis    Arthritis    Cataract    Chronic kidney disease    Depression    Hypertension    Mild persistent asthma without complication 09/28/2021   OSA (obstructive sleep apnea) 06/24/2017   Plaque psoriasis     Assessment: Patient Reported Symptoms:  Cognitive Cognitive Status: Alert and oriented to person, place, and time, Insightful and able to interpret abstract concepts, Normal speech and language skills Cognitive/Intellectual Conditions Management [RPT]: None reported or documented in medical history or problem list   Health Maintenance Behaviors: Hobbies, Sleep adequate, Social activities Healing Pattern: Unsure Health Facilitated by: Pain control, Rest  Neurological Neurological Review of Symptoms: Dizziness, Weakness Neurological Management  Strategies: Adequate rest, Medication therapy, Routine screening Neurological Self-Management Outcome: 3 (uncertain)  HEENT        Cardiovascular Cardiovascular Symptoms Reported: Chest pain or discomfort, Dizziness, Lightheadness Does patient have uncontrolled Hypertension?: No Cardiovascular Management Strategies: Adequate rest, Medication therapy, Routine screening Cardiovascular Self-Management Outcome: 3 (uncertain)  Respiratory Respiratory Symptoms Reported: Chest tightness, Productive cough, Shortness of breath    Endocrine      Gastrointestinal        Genitourinary      Integumentary      Musculoskeletal Musculoskelatal Symptoms Reviewed: Back pain, Difficulty walking, Limited mobility, Muscle pain, Weakness Musculoskeletal Management Strategies: Routine screening, Medication therapy, Adequate rest Musculoskeletal Self-Management Outcome: 3 (uncertain) Falls in the past year?: No Number of falls in past year: 1 or less Was there an injury with Fall?: No Fall Risk Category Calculator: 0 Patient Fall Risk Level: Low Fall Risk Patient at Risk for Falls Due to: Impaired mobility Fall risk Follow up: Falls evaluation completed  Psychosocial       Quality of Family Relationships: helpful, supportive Do you feel physically threatened by others?: No    05/14/2024    PHQ2-9 Depression Screening   Little interest or pleasure in doing things    Feeling down, depressed, or hopeless    PHQ-2 - Total Score    Trouble falling or staying asleep, or sleeping too much    Feeling tired or having little energy    Poor appetite or overeating     Feeling bad about yourself - or that you are a failure or have let yourself or  your family down    Trouble concentrating on things, such as reading the newspaper or watching television    Moving or speaking so slowly that other people could have noticed.  Or the opposite - being so fidgety or restless that you have been moving around a lot  more than usual    Thoughts that you would be better off dead, or hurting yourself in some way    PHQ2-9 Total Score    If you checked off any problems, how difficult have these problems made it for you to do your work, take care of things at home, or get along with other people    Depression Interventions/Treatment      There were no vitals filed for this visit.  Medications Reviewed Today     Reviewed by Michael Suzen CROME, RN (Registered Nurse) on 05/07/24 at [phone]  Med List Status: <None>   Medication Order Taking? Sig Documenting Provider Last Dose Status Informant  ammonium lactate (LAC-HYDRIN) 12 % lotion [id number]  APPLY TO THE AFFECTED AREAS OF SKIN BID PRN [provider]  Active   busPIRone  (BUSPAR ) 10 MG tablet [id number]  Take 1 tablet (10 mg total) by mouth 2 (two) times daily. [name], DO  Active   clonazePAM  (KLONOPIN ) 0.5 MG tablet [id number] Yes TAKE 1 TABLET BY MOUTH TWICE DAILY AS NEEDED FOR ANXIETY Frann, Mabel Mt, DO  Active   fluticasone  (FLONASE ) 50 MCG/ACT nasal spray [id number] Yes Place 2 sprays into both nostrils daily. [name], DO  Active   fluticasone -salmeterol (ADVAIR) 500-50 MCG/ACT AEPB [id number] Yes Inhale 1 puff into the lungs in the morning and at bedtime. [name], DO  Active   HYDROcodone -acetaminophen  (NORCO/VICODIN) 5-325 MG tablet [id number] Yes 1 tablet Orally every 8 hrs; Duration: 30 days [provider]  Active   levocetirizine (XYZAL ) 5 MG tablet [id number] Yes Take 1 tablet (5 mg total) by mouth every evening. [name], DO  Active   lisinopril  (ZESTRIL ) 20 MG tablet [id number]  TAKE 1 TABLET BY MOUTH DAILY. [name], DO  Active   QUEtiapine  (SEROQUEL ) 50 MG tablet [id number]  Take 1 tablet (50 mg total) by mouth at bedtime. [name], DO  Active   SKYRIZI PEN 150 MG/ML SOAJ [id number]  Inject into the skin. [provider]  Active    Spacer/Aero-Hold Chamber Mask MISC [id number] Yes Use with albuterol . [name], DO  Active   tadalafil  (CIALIS ) 20 MG tablet [id number]  Take 0.5-1 tablets (10-20 mg total) by mouth every other day as needed for erectile dysfunction. [name], DO  Active   Talazoparib Tosylate (TALZENNA PO) [id number]  Take by mouth. [provider]  Active   venlafaxine  XR (EFFEXOR  XR) 150 MG 24 hr capsule [id number]  Take 1 capsule (150 mg total) by mouth daily with breakfast. [name], DO  Active             Recommendation:   PCP Follow-up Continue Current Plan of Care Monitor for hypotension  Follow Up Plan:   Per re assigned RN CM    Sathvika Ojo L. Ramonita, RN, BSN, CCM [place]  [facility], [facility] RN Care Manager Direct Dial: [phone]  Fax: [phone]

## 2024-05-07 NOTE — Patient Instructions (Signed)
 Visit Information  Thank you for taking time to visit with me today. Please don't hesitate to contact me if I can be of assistance to you before our next scheduled appointment.  Our next appointment is by telephone on 06/11/24 at with Rosaline Finlay Please call the care guide team at [phone] if you need to cancel or reschedule your appointment.   Following is a copy of your care plan:   Goals Addressed   None     Please call the Suicide and Crisis Lifeline: 988 call the [place]  National Suicide Prevention Lifeline: 6184254412 or TTY: (415)446-3919 TTY (718)334-0903) to talk to a trained counselor call 1-800-273-TALK (toll free, 24 hour hotline) call 911 if you are experiencing a Mental Health or Behavioral Health Crisis or need someone to talk to.  Patient verbalizes understanding of instructions and care plan provided today and agrees to view in MyChart. Active MyChart status and patient understanding of how to access instructions and care plan via MyChart confirmed with patient.     Syler Norcia L. Ramonita, RN, BSN, CCM [place]  [facility], [facility] RN Care Manager Direct Dial: [phone]  Fax: [phone]    Visit Information  Mr. Kulish was given information about Medicaid Managed Care team care coordination services as a part of their [facility] Medicaid benefit.   If you would like to schedule transportation through your [facility] plan, please call the following number at least 2 days in advance of your appointment: [phone].   You can also use the MTM portal or MTM mobile app to manage your rides. Reimbursement for transportation is available through [facility]! For the portal, please go to [email or web address]  Call the [facility] Crisis Line at [phone], at any time, 24 hours a day, 7 days a week. If you are in danger or need immediate medical attention call 911.  Please see education materials related to hypotension,  chronic pain  provided as print materials.   Patient verbalizes understanding of instructions and care plan provided today and agrees to view in MyChart. Active MyChart status and patient understanding of how to access instructions and care plan via MyChart confirmed with patient.     RN Care Manager will outreach to patient via telephone on 06/11/24  Elidia Bonenfant L. Ramonita, RN, BSN, CCM [place]  [facility], [facility] RN Care Manager Direct Dial: [phone]  Fax: [phone]   Following is a copy of your plan of care:  There are no care plans that you recently modified to display for this patient.

## 2024-05-11 ENCOUNTER — Other Ambulatory Visit: Payer: Self-pay | Admitting: Family Medicine

## 2024-05-11 ENCOUNTER — Encounter: Payer: Self-pay | Admitting: Family Medicine

## 2024-05-11 ENCOUNTER — Ambulatory Visit: Admitting: Family Medicine

## 2024-05-11 VITALS — BP 128/80 | HR 74 | Temp 97.4°F | Resp 16 | Ht 67.0 in | Wt 253.8 lb

## 2024-05-11 DIAGNOSIS — J4540 Moderate persistent asthma, uncomplicated: Secondary | ICD-10-CM | POA: Diagnosis not present

## 2024-05-11 DIAGNOSIS — F419 Anxiety disorder, unspecified: Secondary | ICD-10-CM | POA: Diagnosis not present

## 2024-05-11 MED ORDER — QUETIAPINE FUMARATE 100 MG PO TABS: 100.0000 mg | ORAL_TABLET | Freq: Every day | ORAL | 1 refills | Status: AC

## 2024-05-11 MED ORDER — FLUTICASONE-SALMETEROL 500-50 MCG/ACT IN AEPB
1.0000 | INHALATION_SPRAY | Freq: Two times a day (BID) | RESPIRATORY_TRACT | 2 refills | Status: AC
Start: 2024-05-11 — End: ?

## 2024-05-11 MED ORDER — FLUTICASONE PROPIONATE 50 MCG/ACT NA SUSP: 2.0000 | Freq: Every day | NASAL | 2 refills | Status: AC

## 2024-05-11 NOTE — Progress Notes (Addendum)
 Chief Complaint  Patient presents with   Follow-up    Follow Up    Subjective Michael Solomon presents for f/u anxiety.  Pt is currently being treated with Seroquel  50 mg/d, BuSpar  10 mg bid, Effexor  XR 150 mg/d, Klonopin  0.5 mg bid prn (usually around a few times per week).  Reports some improvement with sleep since increasing Seroquel  from 25 mg nightly to 50 mg daily. No thoughts of harming self or others. No self-medication with alcohol, prescription drugs or illicit drugs. Pt is not following with a counselor/psychologist.  Past Medical History:  Diagnosis Date   Anxiety    Arthritis    Arthritis    Cataract    Chronic kidney disease    Depression    Hypertension    Mild persistent asthma without complication 09/28/2021   OSA (obstructive sleep apnea) 06/24/2017   Plaque psoriasis    Exam BP 128/80 (BP Location: Left Arm, Patient Position: Sitting)   Pulse 74   Temp (!) 97.4 F (36.3 C) (Oral)   Resp 16   Ht 5' 7 (1.702 m)   Wt 253 lb 12.8 oz (115.1 kg)   SpO2 96%   BMI 39.75 kg/m  General:  well developed, well nourished, in no apparent distress Heart: RRR HEENT: Ear canals are patent without otorrhea.  TMs negative bilaterally.  No sinus TTP bilaterally.  PERRLA, sclera white.  Nares are patent without rhinorrhea.  MMM.  No pharyngeal exudate or erythema. Lungs:  CTAB. No respiratory distress Psych: Flat affect, age appropriate judgment and insight  Assessment and Plan  Anxiety  Moderate persistent asthma, unspecified whether complicated - Plan: fluticasone -salmeterol (ADVAIR) 500-50 MCG/ACT AEPB  Chronic, uncontrolled. Increase Seroquel  100 mg/d, cont BuSpar  10 mg bid, Effexor  XR 150 mg/d, Klonopin  0.5 mg bid prn (usually around a few times per week). Consider counseling. F/u in 1 mo. Refill inhaler.  Rinse mouth out after usage. The patient voiced understanding and agreement to the plan.  Michael Mt [place], DO 05/11/24 11:10 AM

## 2024-05-11 NOTE — Patient Instructions (Signed)
 Keep the diet clean and stay active.  Go on the inhaler for the next few weeks and as needed for coughing.  Let us  know if you need anything.

## 2024-05-25 DIAGNOSIS — M170 Bilateral primary osteoarthritis of knee: Secondary | ICD-10-CM | POA: Diagnosis not present

## 2024-05-27 ENCOUNTER — Telehealth: Payer: Self-pay | Admitting: Family Medicine

## 2024-05-27 ENCOUNTER — Other Ambulatory Visit: Payer: Self-pay

## 2024-05-27 DIAGNOSIS — F419 Anxiety disorder, unspecified: Secondary | ICD-10-CM

## 2024-05-27 NOTE — Telephone Encounter (Signed)
 Copied from CRM [id number]. Topic: Referral - Request for Referral >> May 26, 2024  4:21 PM Ashley R wrote: Did the patient discuss referral with their provider in the last year? Yes - was thinking it over and calling in now to one sent   Appointment offered? Yes  Type of order/referral and detailed reason for visit: Psychologist  Preference of office, provider, location: [place]  If referral order, have you been seen by this specialty before? No (If Yes, this issue or another issue? When? Where?  Can we respond through MyChart? No

## 2024-05-27 NOTE — Telephone Encounter (Signed)
 Referral sent and message sent to pt.

## 2024-05-28 ENCOUNTER — Ambulatory Visit: Admitting: Family Medicine

## 2024-06-08 ENCOUNTER — Telehealth: Payer: Self-pay | Admitting: Family Medicine

## 2024-06-08 NOTE — Telephone Encounter (Signed)
 Copied from CRM #[id number]. Topic: Referral - Question >> Jun 08, 2024  3:56 PM Robinson H wrote: Reason for CRM: Patient followed up on Behavioral Health referral, advised patient referral is in system. Reached out to office to transfer patient and was advised by Dr. Karenann that she is out of network with patients insurance and patient could possibly be seen at [facility] their number [phone].  Osmani [phone]

## 2024-06-09 NOTE — Addendum Note (Signed)
 Addended by: Jidenna Figgs M on: 06/09/2024 07:11 AM   Modules accepted: Orders

## 2024-06-09 NOTE — Telephone Encounter (Signed)
 Referral has been updated and message sent to pt.

## 2024-06-16 ENCOUNTER — Ambulatory Visit: Admitting: Family Medicine

## 2024-06-16 ENCOUNTER — Encounter: Payer: Self-pay | Admitting: Family Medicine

## 2024-06-16 VITALS — BP 118/70 | HR 100 | Temp 98.0°F | Resp 16 | Ht 67.0 in | Wt 248.0 lb

## 2024-06-16 DIAGNOSIS — H6993 Unspecified Eustachian tube disorder, bilateral: Secondary | ICD-10-CM

## 2024-06-16 DIAGNOSIS — F419 Anxiety disorder, unspecified: Secondary | ICD-10-CM

## 2024-06-16 DIAGNOSIS — I10 Essential (primary) hypertension: Secondary | ICD-10-CM

## 2024-06-16 DIAGNOSIS — M47816 Spondylosis without myelopathy or radiculopathy, lumbar region: Secondary | ICD-10-CM | POA: Diagnosis not present

## 2024-06-16 DIAGNOSIS — Z23 Encounter for immunization: Secondary | ICD-10-CM | POA: Diagnosis not present

## 2024-06-16 MED ORDER — AZELASTINE HCL 0.1 % NA SOLN: 2.0000 | Freq: Two times a day (BID) | NASAL | 12 refills | Status: AC

## 2024-06-16 MED ORDER — LISINOPRIL 20 MG PO TABS: 20.0000 mg | ORAL_TABLET | Freq: Every day | ORAL | 2 refills | Status: AC

## 2024-06-16 NOTE — Patient Instructions (Addendum)
 Consider taking Vit B12 1000 mcg daily to see if it helps with your memory.   If you do not hear anything about your referral in the next 1-2 weeks, call our office and ask for an update.  OK to use Debrox (peroxide) in the ear to loosen up wax. Also recommend using a bulb syringe (for removing boogers from baby's noses) to flush through warm water and vinegar (3-4:1 ratio). An alternative, though more expensive, is an elephant ear washer wax removal kit. Do not use Q-tips as this can impact wax further.  Let us  know if you need anything.

## 2024-06-16 NOTE — Progress Notes (Signed)
 Chief Complaint  Patient presents with   Follow-up    Follow Up    Subjective Michael Solomon presents for f/u anxiety/depression.  Pt is currently being treated with BuSpar  10 mg bid, Seroquel  100 mg/d, Klonopin  0.5 mg bid, Effexor  150 mg/d.  Reports doing OK since treatment. No thoughts of harming self or others. No self-medication with alcohol, prescription drugs or illicit drugs. Pt is not following with a counselor/psychologist.  Hypertension Patient presents for hypertension follow up. He does not monitor home blood pressures. He is compliant with medication- lisinopril  20 mg/d. Patient has these side effects of medication: none He is sometimes adhering to a healthy diet overall. Exercise: limited No CP or SOB.   Past Medical History:  Diagnosis Date   Anxiety    Arthritis    Arthritis    Cataract    Chronic kidney disease    Depression    Hypertension    Mild persistent asthma without complication 09/28/2021   OSA (obstructive sleep apnea) 06/24/2017   Plaque psoriasis    Allergies as of 06/16/2024   No Known Allergies      Medication List        Accurate as of June 16, 2024  3:42 PM. If you have any questions, ask your nurse or doctor.          ammonium lactate 12 % lotion Commonly known as: LAC-HYDRIN APPLY TO THE AFFECTED AREAS OF SKIN BID PRN   azelastine 0.1 % nasal spray Commonly known as: ASTELIN Place 2 sprays into both nostrils 2 (two) times daily. Use in each nostril as directed Started by: Mabel Deward Pry   busPIRone  10 MG tablet Commonly known as: BUSPAR  Take 1 tablet (10 mg total) by mouth 2 (two) times daily.   clonazePAM  0.5 MG tablet Commonly known as: KLONOPIN  TAKE 1 TABLET BY MOUTH TWICE DAILY AS NEEDED FOR ANXIETY   fluticasone  50 MCG/ACT nasal spray Commonly known as: FLONASE  Place 2 sprays into both nostrils daily.   fluticasone -salmeterol 500-50 MCG/ACT Aepb Commonly known as: ADVAIR Inhale 1 puff into the  lungs in the morning and at bedtime. Rinse mouth out after use.   HYDROcodone -acetaminophen  5-325 MG tablet Commonly known as: NORCO/VICODIN 1 tablet Orally every 8 hrs; Duration: 30 days   levocetirizine 5 MG tablet Commonly known as: XYZAL  Take 1 tablet (5 mg total) by mouth every evening.   lisinopril  20 MG tablet Commonly known as: ZESTRIL  Take 1 tablet (20 mg total) by mouth daily.   QUEtiapine  100 MG tablet Commonly known as: SEROquel  Take 1 tablet (100 mg total) by mouth at bedtime.   Skyrizi Pen 150 MG/ML pen Generic drug: risankizumab-rzaa Inject into the skin.   Spacer/Aero-Hold Education Officer, [employer] Use with albuterol .   tadalafil  20 MG tablet Commonly known as: CIALIS  Take 0.5-1 tablets (10-20 mg total) by mouth every other day as needed for erectile dysfunction.   TALZENNA PO Take by mouth.   venlafaxine  XR 150 MG 24 hr capsule Commonly known as: Effexor  XR Take 1 capsule (150 mg total) by mouth daily with breakfast.        Exam BP 118/70 (BP Location: Left Arm, Patient Position: Sitting)   Pulse 100   Temp 98 F (36.7 C) (Oral)   Resp 16   Ht 5' 7 (1.702 m)   Wt 248 lb (112.5 kg)   SpO2 95%   BMI 38.84 kg/m  General:  well developed, well nourished, in no apparent distress Heart: RRR, no lower  extremity edema, no bruits Ears: Canals patent without otorrhea, canals are erythematous from trauma, TMs negative bilaterally Lungs:  CTAB. No respiratory distress Psych: well oriented with normal range of affect and age-appropriate judgement/insight, alert and oriented x4.  Assessment and Plan  Anxiety - Plan: Ambulatory referral to Psychiatry  Essential hypertension - Plan: lisinopril  (ZESTRIL ) 20 MG tablet  Dysfunction of both eustachian tubes - Plan: azelastine (ASTELIN) 0.1 % nasal spray  Need for influenza vaccination - Plan: Flu vaccine trivalent PF, 6mos and older(Flulaval,Afluria,Fluarix,Fluzone)  Chronic, relatively stable.  Continue  Klonopin  0.5 mg twice daily as needed, Effexor  XR 150 mg daily, Seroquel  100 mg nightly, BuSpar  10 mg twice daily.  Refer to psychiatry at his request.  Hopefully they can find him 1 in [place]. Chronic, stable.  Continue lisinopril  20 mg daily.  Counseled on diet and exercise. Add Astelin nasal spray to Flonase .  Do not use Q-tips. Flu shot today. The patient voiced understanding and agreement to the plan.  Mabel Mt [place], DO 06/16/24 3:42 PM

## 2024-06-19 ENCOUNTER — Telehealth: Payer: Self-pay | Admitting: Family Medicine

## 2024-06-19 NOTE — Telephone Encounter (Signed)
 Copied from CRM [id number]. Topic: Referral - Question >> Jun 18, 2024  5:08 PM Vivian Z wrote: Reason for CRM: Cassania from [facility] called stating that they received a packet for this patient and would like a callback.  Callback: [phone] Ext [phone]

## 2024-06-19 NOTE — Telephone Encounter (Signed)
 Return call back regarding Michael Solomon from [facility], referral was sent per pt.

## 2024-06-22 ENCOUNTER — Ambulatory Visit: Payer: Self-pay

## 2024-06-22 NOTE — Telephone Encounter (Signed)
 FYI Only or Action Required?: Action required by provider: medication refill request. Pt is out of pain medication since 11/11. Pt has been in pain for a long time and does not have any pain medication.  Patient was last seen in primary care on 06/16/2024 by [name], DO.  Called Nurse Triage reporting Pain. - full body  Symptoms began several years ago.  Interventions attempted: Nothing. - seen at pain clinic  Symptoms are: gradually worsening.  Triage Disposition: Call PCP Now  Patient/caregiver understands and will follow disposition?: Yes                        Copied from CRM #[id number]. Topic: Clinical - Red Word Triage >> Jun 22, 2024  9:43 AM [name] wrote: Red Word that prompted transfer to Nurse Triage: pain in whole body started a long time ago Reason for Disposition  [1] Prescription refill request for ESSENTIAL medicine (i.e., likelihood of harm to patient if not taken) AND [2] triager unable to refill per department policy  Answer Assessment - Initial Assessment Questions 1. DRUG NAME: What medicine do you need to have refilled?     Pain medication 2. REFILLS REMAINING: How many refills are remaining? Notes: The label on the medicine or pill bottle will show how many refills are remaining. If there are no refills remaining, then a renewal may be needed.     none  4. PRESCRIBER: Who prescribed it? Note: The prescribing doctor or group is responsible for refill approvals..     Pain clinic  6. SYMPTOMS: Do you have any symptoms?     Pain  Protocols used: Medication Refill and Renewal Call-A-AH

## 2024-06-22 NOTE — Telephone Encounter (Signed)
 Called pt was advised he would need to contact pain management for pain medication per Dr.Wendling. Advised pt per Dr.Wendling he would not be able to send any pain medication, appt schedule to discuss.

## 2024-06-23 ENCOUNTER — Telehealth: Payer: Self-pay

## 2024-06-23 ENCOUNTER — Encounter: Payer: Self-pay | Admitting: Family Medicine

## 2024-06-23 ENCOUNTER — Ambulatory Visit: Admitting: Family Medicine

## 2024-06-23 VITALS — BP 120/72 | HR 100 | Temp 98.0°F | Resp 16 | Ht 67.0 in

## 2024-06-23 DIAGNOSIS — G8929 Other chronic pain: Secondary | ICD-10-CM

## 2024-06-23 DIAGNOSIS — M545 Low back pain, unspecified: Secondary | ICD-10-CM | POA: Diagnosis not present

## 2024-06-23 MED ORDER — AMITRIPTYLINE HCL 10 MG PO TABS: 10.0000 mg | ORAL_TABLET | Freq: Every day | ORAL | 1 refills | Status: AC

## 2024-06-23 NOTE — Addendum Note (Signed)
 Addended by: Kristain Filo M on: 06/23/2024 05:10 PM   Modules accepted: Orders

## 2024-06-23 NOTE — Telephone Encounter (Signed)
 Called spoke with [facility]  provider Jeannetta Rush. I spoke with PA team the lady who did the PA Nelda said Hydrocodone  was approved. Pt was advised of the PA was what they had wait on and he needs to contact his pharmacy.

## 2024-06-23 NOTE — Progress Notes (Signed)
 Chief Complaint  Patient presents with   Medication Refill    Medication follow up    Subjective: Patient is a 53 y.o. male here for f/u pain.  He has been receiving hydrocodone  from his pain management clinic in [place].  For the past 7 days, he has been without his narcotics.  He has called them and has not had a reply for the past 7 days.  He is requesting a referral to a new office.  Past Medical History:  Diagnosis Date   Anxiety    Arthritis    Arthritis    Cataract    Chronic kidney disease    Depression    Hypertension    Mild persistent asthma without complication 09/28/2021   OSA (obstructive sleep apnea) 06/24/2017   Plaque psoriasis     Objective: BP 120/72 (BP Location: Left Arm, Patient Position: Sitting)   Pulse 100   Temp 98 F (36.7 C) (Oral)   Resp 16   Ht 5' 7 (1.702 m)   SpO2 96%   BMI 38.84 kg/m  General: Awake, appears stated age Lungs: No accessory muscle use Psych: Age appropriate judgment and insight, normal affect and mood  Assessment and Plan: Chronic bilateral low back pain without sciatica - Plan: Ambulatory referral to Pain Clinic  Chronic, uncontrolled.  Add Elavil 10 mg nightly to his routine.  Would prefer to stay away from narcotics we does not violate any pain contract.  Our team will reach out to his pain clinic to find out what the situation is.  Refer to [facility] pain management in the meanwhile. The patient voiced understanding and agreement to the plan.  Mabel Mt [place], DO 06/23/24  4:18 PM

## 2024-06-23 NOTE — Telephone Encounter (Signed)
 Great, we can cancel the new referral to [facility] pain. Thx.

## 2024-06-23 NOTE — Telephone Encounter (Signed)
 Referral to [facility] pain cancel

## 2024-06-23 NOTE — Patient Instructions (Signed)
 If you do not hear anything about your referral in the next 1-2 weeks, call our office and ask for an update.  We will also be reaching out to [facility] pain management in the meanwhile.   Let us  know if you need anything.

## 2024-06-29 ENCOUNTER — Other Ambulatory Visit: Payer: Self-pay | Admitting: Family Medicine

## 2024-06-30 ENCOUNTER — Telehealth: Payer: Self-pay | Admitting: Family Medicine

## 2024-06-30 NOTE — Telephone Encounter (Signed)
 Copied from CRM #[id number]. Topic: Referral - Status >> Jun 30, 2024  2:26 PM Macario HERO wrote: Reason for CRM: Patient called regarding the status of his pain medicine referral

## 2024-06-30 NOTE — Telephone Encounter (Signed)
 Will forward to [facility].

## 2024-06-30 NOTE — Telephone Encounter (Signed)
 Please advise

## 2024-07-03 ENCOUNTER — Encounter: Payer: Self-pay | Admitting: Licensed Clinical Social Worker

## 2024-07-03 NOTE — Patient Outreach (Signed)
 07/03/2024  SW reviewed why no follow call had not been made and the last call the patient declined and cancelled appt no further folow up needed.   Tobias CHARM Maranda HEDWIG, PhD [facility], [facility] Social Worker Direct Dial: [phone]  Fax: [phone]

## 2024-12-14 ENCOUNTER — Encounter: Admitting: Family Medicine
